# Patient Record
Sex: Female | Born: 1990 | Race: Black or African American | Hispanic: No | Marital: Married | Smoking: Current every day smoker
Health system: Southern US, Community
[De-identification: ages and names within clinical notes are randomized; demographics above are authoritative.]

## PROBLEM LIST (undated history)

## (undated) DIAGNOSIS — I959 Hypotension, unspecified: Secondary | ICD-10-CM

## (undated) DIAGNOSIS — N200 Calculus of kidney: Secondary | ICD-10-CM

## (undated) HISTORY — PX: KIDNEY STONE SURGERY: SHX686

## (undated) HISTORY — DX: Hypotension, unspecified: I95.9

## (undated) HISTORY — DX: Calculus of kidney: N20.0

---

## 2009-03-17 ENCOUNTER — Observation Stay: Payer: Self-pay | Admitting: Obstetrics and Gynecology

## 2009-03-31 ENCOUNTER — Inpatient Hospital Stay: Payer: Self-pay | Admitting: Obstetrics and Gynecology

## 2009-03-31 ENCOUNTER — Observation Stay: Payer: Self-pay | Admitting: Obstetrics and Gynecology

## 2011-09-30 ENCOUNTER — Emergency Department: Payer: Self-pay | Admitting: Emergency Medicine

## 2013-10-25 ENCOUNTER — Emergency Department: Payer: Self-pay | Admitting: Internal Medicine

## 2013-10-25 LAB — COMPREHENSIVE METABOLIC PANEL
ALBUMIN: 3.6 g/dL (ref 3.4–5.0)
ALK PHOS: 52 U/L
Anion Gap: 7 (ref 7–16)
BILIRUBIN TOTAL: 0.3 mg/dL (ref 0.2–1.0)
BUN: 11 mg/dL (ref 7–18)
CREATININE: 0.71 mg/dL (ref 0.60–1.30)
Calcium, Total: 9 mg/dL (ref 8.5–10.1)
Chloride: 104 mmol/L (ref 98–107)
Co2: 25 mmol/L (ref 21–32)
EGFR (Non-African Amer.): >60
Glucose: 76 mg/dL (ref 65–99)
OSMOLALITY: 270 (ref 275–301)
Potassium: 3.6 mmol/L (ref 3.5–5.1)
SGOT(AST): 17 U/L (ref 15–37)
SGPT (ALT): 15 U/L (ref 12–78)
SODIUM: 136 mmol/L (ref 136–145)
TOTAL PROTEIN: 8 g/dL (ref 6.4–8.2)

## 2013-10-25 LAB — CBC WITH DIFFERENTIAL/PLATELET
Basophil #: 0 10*3/uL (ref 0.0–0.1)
Basophil %: 0.5 %
Eosinophil #: 0 10*3/uL (ref 0.0–0.7)
Eosinophil %: 0.1 %
HCT: 40.8 % (ref 35.0–47.0)
HGB: 13.3 g/dL (ref 12.0–16.0)
Lymphocyte #: 1 10*3/uL (ref 1.0–3.6)
Lymphocyte %: 26.3 %
MCH: 29 pg (ref 26.0–34.0)
MCHC: 32.6 g/dL (ref 32.0–36.0)
MCV: 89 fL (ref 80–100)
Monocyte #: 0.8 x10 3/mm (ref 0.2–0.9)
Monocyte %: 20.9 %
NEUTROS PCT: 52.2 %
Neutrophil #: 2 10*3/uL (ref 1.4–6.5)
Platelet: 189 10*3/uL (ref 150–440)
RBC: 4.58 10*6/uL (ref 3.80–5.20)
RDW: 16.6 % — ABNORMAL HIGH (ref 11.5–14.5)
WBC: 3.9 10*3/uL (ref 3.6–11.0)

## 2013-10-25 LAB — LIPASE, BLOOD: Lipase: 282 U/L (ref 73–393)

## 2014-08-10 ENCOUNTER — Emergency Department: Payer: Self-pay | Admitting: Emergency Medicine

## 2014-08-10 LAB — CBC
HCT: 36.4 % (ref 35.0–47.0)
HGB: 11.6 g/dL — AB (ref 12.0–16.0)
MCH: 29.3 pg (ref 26.0–34.0)
MCHC: 31.9 g/dL — ABNORMAL LOW (ref 32.0–36.0)
MCV: 92 fL (ref 80–100)
PLATELETS: 233 10*3/uL (ref 150–440)
RBC: 3.95 10*6/uL (ref 3.80–5.20)
RDW: 16.7 % — ABNORMAL HIGH (ref 11.5–14.5)
WBC: 8.2 10*3/uL (ref 3.6–11.0)

## 2014-08-10 LAB — URINALYSIS, COMPLETE
BILIRUBIN, UR: NEGATIVE
Bacteria: NONE SEEN
Glucose,UR: NEGATIVE mg/dL (ref 0–75)
KETONE: NEGATIVE
Leukocyte Esterase: NEGATIVE
Nitrite: NEGATIVE
PROTEIN: NEGATIVE
Ph: 7 (ref 4.5–8.0)
RBC,UR: 9 /HPF (ref 0–5)
Specific Gravity: 1.015 (ref 1.003–1.030)
Squamous Epithelial: 2
WBC UR: 1 /HPF (ref 0–5)

## 2014-08-10 LAB — GC/CHLAMYDIA PROBE AMP

## 2014-08-10 LAB — WET PREP, GENITAL

## 2014-08-10 LAB — HCG, QUANTITATIVE, PREGNANCY: BETA HCG, QUANT.: 2809 m[IU]/mL — AB

## 2014-09-24 NOTE — L&D Delivery Note (Signed)
Delivery Summary for Barbara Mendez  Labor Events:   Preterm labor:   Rupture date:   Rupture time:   Rupture type: Intact  Fluid Color:   Induction:   Augmentation:   Complications:   Cervical ripening:          Delivery:   Episiotomy:   Lacerations:   Repair suture:   Repair # of packets:   Blood loss (ml): 600   Information for the patient's newborn:  Tameka, Hoiland [[id number]]    Delivery 06/13/2015 8:17 AM by  C-Section, Low Transverse Sex:  female Gestational Age: [redacted]w[redacted]d Delivery Clinician:  Hildred Laser Living?: Yes        APGARS  One minute Five minutes Ten minutes  Skin color: 0   1      Heart rate: 2   2      Grimace: 2   2      Muscle tone: 2   2      Breathing: 2   2      Totals: 8  9      Presentation/position:      Resuscitation:   Cord information:    Disposition of cord blood:     Blood gases sent?  Complications:   Placenta: Delivered:       appearance Newborn Measurements: Weight: 5 lb 15.6 oz (2710 g)  Height:    Head circumference:    Chest circumference:    Other providers: Registered Nurse Transition RN Venetia Night  Additional  information: Forceps:   Vacuum:   Breech:   Observed anomalies        See Cesarean Operative Note for details of procedure.    Hildred Laser, MD [facility]

## 2014-12-16 LAB — OB RESULTS CONSOLE HGB/HCT, BLOOD
HCT: 34 %
Hemoglobin: 11.3 g/dL

## 2014-12-16 LAB — OB RESULTS CONSOLE HEPATITIS B SURFACE ANTIGEN: Hepatitis B Surface Ag: NEGATIVE

## 2014-12-16 LAB — OB RESULTS CONSOLE HIV ANTIBODY (ROUTINE TESTING): HIV: NONREACTIVE

## 2014-12-16 LAB — OB RESULTS CONSOLE GC/CHLAMYDIA
CHLAMYDIA, DNA PROBE: NEGATIVE
Gonorrhea: NEGATIVE

## 2014-12-16 LAB — OB RESULTS CONSOLE RUBELLA ANTIBODY, IGM: RUBELLA: IMMUNE

## 2014-12-16 LAB — OB RESULTS CONSOLE VARICELLA ZOSTER ANTIBODY, IGG: Varicella: IMMUNE

## 2014-12-16 LAB — OB RESULTS CONSOLE ANTIBODY SCREEN: Antibody Screen: NEGATIVE

## 2014-12-16 LAB — OB RESULTS CONSOLE ABO/RH: RH Type: POSITIVE

## 2014-12-16 LAB — OB RESULTS CONSOLE PLATELET COUNT: Platelets: 277 10*3/uL

## 2014-12-16 LAB — OB RESULTS CONSOLE RPR: RPR: NONREACTIVE

## 2015-03-10 ENCOUNTER — Ambulatory Visit (INDEPENDENT_AMBULATORY_CARE_PROVIDER_SITE_OTHER): Payer: Medicaid Other | Admitting: Obstetrics and Gynecology

## 2015-03-10 VITALS — BP 101/68 | HR 89 | Ht 65.0 in | Wt 152.0 lb

## 2015-03-10 DIAGNOSIS — O34219 Maternal care for unspecified type scar from previous cesarean delivery: Secondary | ICD-10-CM | POA: Insufficient documentation

## 2015-03-10 DIAGNOSIS — I959 Hypotension, unspecified: Secondary | ICD-10-CM | POA: Insufficient documentation

## 2015-03-10 DIAGNOSIS — O2652 Maternal hypotension syndrome, second trimester: Secondary | ICD-10-CM

## 2015-03-10 DIAGNOSIS — O3421 Maternal care for scar from previous cesarean delivery: Secondary | ICD-10-CM

## 2015-03-10 DIAGNOSIS — Z3482 Encounter for supervision of other normal pregnancy, second trimester: Secondary | ICD-10-CM

## 2015-03-10 DIAGNOSIS — O09292 Supervision of pregnancy with other poor reproductive or obstetric history, second trimester: Secondary | ICD-10-CM

## 2015-03-10 DIAGNOSIS — O4441 Low lying placenta NOS or without hemorrhage, first trimester: Secondary | ICD-10-CM | POA: Insufficient documentation

## 2015-03-10 DIAGNOSIS — O09299 Supervision of pregnancy with other poor reproductive or obstetric history, unspecified trimester: Secondary | ICD-10-CM | POA: Insufficient documentation

## 2015-03-10 DIAGNOSIS — Z87891 Personal history of nicotine dependence: Secondary | ICD-10-CM

## 2015-03-10 DIAGNOSIS — Z3492 Encounter for supervision of normal pregnancy, unspecified, second trimester: Secondary | ICD-10-CM

## 2015-03-10 DIAGNOSIS — R110 Nausea: Secondary | ICD-10-CM

## 2015-03-10 LAB — POCT URINALYSIS DIPSTICK
BILIRUBIN UA: NEGATIVE
Glucose, UA: NEGATIVE
Ketones, UA: NEGATIVE
LEUKOCYTES UA: NEGATIVE
Nitrite, UA: NEGATIVE
PH UA: 6.5
Protein, UA: NEGATIVE
Spec Grav, UA: 1.02
Urobilinogen, UA: 0.2

## 2015-03-10 MED ORDER — DOXYLAMINE-PYRIDOXINE 10-10 MG PO TBEC: 2.0000 | DELAYED_RELEASE_TABLET | Freq: Every day | ORAL | Status: DC

## 2015-03-10 NOTE — Progress Notes (Signed)
Patient reports that nausea (without vomiting) has returned.  Has not tried anything to alleviate symptoms.  Will give refill on prescription for Diclegis.  RTC in 4 weeks.  For glucola at that time.

## 2015-03-10 NOTE — Patient Instructions (Signed)
Begin Diclegis again for nausea, as needed.  RTC in 4 weeks.

## 2015-04-07 ENCOUNTER — Encounter: Payer: Self-pay | Admitting: Obstetrics and Gynecology

## 2015-04-07 ENCOUNTER — Ambulatory Visit (INDEPENDENT_AMBULATORY_CARE_PROVIDER_SITE_OTHER): Payer: Medicaid Other | Admitting: Obstetrics and Gynecology

## 2015-04-07 VITALS — BP 96/61 | HR 101 | Wt 159.2 lb

## 2015-04-07 DIAGNOSIS — Z3483 Encounter for supervision of other normal pregnancy, third trimester: Secondary | ICD-10-CM

## 2015-04-07 DIAGNOSIS — O3421 Maternal care for scar from previous cesarean delivery: Secondary | ICD-10-CM

## 2015-04-07 DIAGNOSIS — Z23 Encounter for immunization: Secondary | ICD-10-CM

## 2015-04-07 DIAGNOSIS — Z131 Encounter for screening for diabetes mellitus: Secondary | ICD-10-CM

## 2015-04-07 DIAGNOSIS — Z3493 Encounter for supervision of normal pregnancy, unspecified, third trimester: Secondary | ICD-10-CM

## 2015-04-07 DIAGNOSIS — O34219 Maternal care for unspecified type scar from previous cesarean delivery: Secondary | ICD-10-CM

## 2015-04-07 LAB — POCT URINALYSIS DIPSTICK
BILIRUBIN UA: NEGATIVE
Glucose, UA: NEGATIVE
Ketones, UA: NEGATIVE
LEUKOCYTES UA: NEGATIVE
Nitrite, UA: NEGATIVE
PH UA: 7.5
Protein, UA: NEGATIVE
SPEC GRAV UA: 1.01
Urobilinogen, UA: NEGATIVE

## 2015-04-07 NOTE — Progress Notes (Signed)
ROB: Patient c/o abdominal pain last week, sharp, sudden in right pelvis, radiating to left side while at rest.  Lasted ~ 1 hour then resolved.  Has not had any further episodes.  Recommend adequate hydration, Tylenol prn.   For Tdap today.  Performing glucola screen.  RTC in 2 weeks.  To schedule date for C-section next visit.

## 2015-04-08 LAB — HEMOGLOBIN AND HEMATOCRIT, BLOOD
HEMATOCRIT: 27.6 % — AB (ref 34.0–46.6)
Hemoglobin: 9 g/dL — ABNORMAL LOW (ref 11.1–15.9)

## 2015-04-08 LAB — GLUCOSE TOLERANCE, 1 HOUR: GLUCOSE, 1HR PP: 82 mg/dL (ref 65–199)

## 2015-04-11 ENCOUNTER — Telehealth: Payer: Self-pay

## 2015-04-11 DIAGNOSIS — O99013 Anemia complicating pregnancy, third trimester: Secondary | ICD-10-CM

## 2015-04-11 MED ORDER — FERROUS SULFATE 325 (65 FE) MG PO TABS: 325.0000 mg | ORAL_TABLET | Freq: Two times a day (BID) | ORAL | Status: DC

## 2015-04-11 MED ORDER — FERROUS SULFATE 325 (65 FE) MG PO TABS: 325.0000 mg | ORAL_TABLET | Freq: Every day | ORAL | Status: DC

## 2015-04-11 NOTE — Telephone Encounter (Signed)
LM for pt informing her of normal glucola and the need to take an iron supplement. RX sent in.

## 2015-04-11 NOTE — Telephone Encounter (Signed)
-----   Message from Hildred LaserAnika Cherry, MD sent at 04/10/2015  4:18 PM EDT ----- Please inform patient that 1 hr glucose was normal.  Patient is anemic and should be taking iron twice daily.  Also can prescribe Colace if constipation occurs.

## 2015-04-21 ENCOUNTER — Ambulatory Visit (INDEPENDENT_AMBULATORY_CARE_PROVIDER_SITE_OTHER): Payer: Medicaid Other | Admitting: Obstetrics and Gynecology

## 2015-04-21 ENCOUNTER — Encounter: Payer: Self-pay | Admitting: Obstetrics and Gynecology

## 2015-04-21 VITALS — BP 96/62 | HR 98 | Wt 161.3 lb

## 2015-04-21 DIAGNOSIS — O99013 Anemia complicating pregnancy, third trimester: Secondary | ICD-10-CM

## 2015-04-21 DIAGNOSIS — Z3493 Encounter for supervision of normal pregnancy, unspecified, third trimester: Secondary | ICD-10-CM

## 2015-04-21 DIAGNOSIS — Z3483 Encounter for supervision of other normal pregnancy, third trimester: Secondary | ICD-10-CM

## 2015-04-21 DIAGNOSIS — O3421 Maternal care for scar from previous cesarean delivery: Secondary | ICD-10-CM

## 2015-04-21 DIAGNOSIS — O34219 Maternal care for unspecified type scar from previous cesarean delivery: Secondary | ICD-10-CM

## 2015-04-21 LAB — POCT URINALYSIS DIPSTICK
Bilirubin, UA: NEGATIVE
GLUCOSE UA: NEGATIVE
KETONES UA: NEGATIVE
Nitrite, UA: NEGATIVE
PH UA: 7.5
PROTEIN UA: NEGATIVE
RBC UA: NEGATIVE
Spec Grav, UA: <=1.005
Urobilinogen, UA: 0.2

## 2015-04-21 NOTE — Progress Notes (Signed)
Patient doing well.  No other complaints or concerns.  PTL and fetal movement precautions reviewed.  Patient with mild anemia noted on recent labs, has initiated iron therapy. Normal RGS. C-section scheduled for 06/13/2015.  RTC in 2 weeks.

## 2015-05-05 ENCOUNTER — Ambulatory Visit (INDEPENDENT_AMBULATORY_CARE_PROVIDER_SITE_OTHER): Payer: Medicaid Other | Admitting: Obstetrics and Gynecology

## 2015-05-05 VITALS — BP 96/66 | HR 93 | Wt 164.5 lb

## 2015-05-05 DIAGNOSIS — R630 Anorexia: Secondary | ICD-10-CM

## 2015-05-05 DIAGNOSIS — R319 Hematuria, unspecified: Secondary | ICD-10-CM

## 2015-05-05 DIAGNOSIS — Z3493 Encounter for supervision of normal pregnancy, unspecified, third trimester: Secondary | ICD-10-CM

## 2015-05-05 LAB — POCT URINALYSIS DIPSTICK
Bilirubin, UA: NEGATIVE
GLUCOSE UA: NEGATIVE
Ketones, UA: NEGATIVE
NITRITE UA: NEGATIVE
PROTEIN UA: NEGATIVE
SPEC GRAV UA: 1.01
Urobilinogen, UA: 0.2
pH, UA: 7

## 2015-05-05 NOTE — Progress Notes (Signed)
ROB: Patient c/o decreased appetite and low back pain in a.m.  Advised on nutritional supplements (Boost/Ensure, protein bars or shakes).  Can take Tylenol prn back pain.  PTL precautions given.  Fetal kick counts discussed. RBS and moderate LE on UA today.  Will send for culture. RTC in 2 weeks.

## 2015-05-05 NOTE — Progress Notes (Signed)
Pt c/o of low back pain when she wakes up in the a.m.

## 2015-05-06 LAB — URINE CULTURE

## 2015-05-18 ENCOUNTER — Telehealth: Payer: Self-pay | Admitting: Obstetrics and Gynecology

## 2015-05-18 ENCOUNTER — Ambulatory Visit (INDEPENDENT_AMBULATORY_CARE_PROVIDER_SITE_OTHER): Payer: Medicaid Other | Admitting: Obstetrics and Gynecology

## 2015-05-18 VITALS — BP 112/77 | HR 73 | Wt 165.5 lb

## 2015-05-18 DIAGNOSIS — Z3493 Encounter for supervision of normal pregnancy, unspecified, third trimester: Secondary | ICD-10-CM

## 2015-05-18 DIAGNOSIS — O3421 Maternal care for scar from previous cesarean delivery: Secondary | ICD-10-CM

## 2015-05-18 DIAGNOSIS — O34219 Maternal care for unspecified type scar from previous cesarean delivery: Secondary | ICD-10-CM

## 2015-05-18 DIAGNOSIS — O26843 Uterine size-date discrepancy, third trimester: Secondary | ICD-10-CM

## 2015-05-18 LAB — POCT URINALYSIS DIPSTICK
Bilirubin, UA: NEGATIVE
Blood, UA: NEGATIVE
Glucose, UA: NEGATIVE
Ketones, UA: NEGATIVE
Leukocytes, UA: NEGATIVE
Nitrite, UA: NEGATIVE
Protein, UA: NEGATIVE
Spec Grav, UA: 1.01
Urobilinogen, UA: NEGATIVE
pH, UA: 6

## 2015-05-18 NOTE — Telephone Encounter (Signed)
Pt informed this was normal, if period like contact office but may have some spotting for next couple of days.

## 2015-05-18 NOTE — Telephone Encounter (Signed)
PT WAS SEEN THIS MORNING AND SHE WAS CHECKED FOR DILATION AND SHE WENT TO USE THE RESTROOM AND THERE IS SOME BLOOD, WOULD LIKE A CALL BACK.

## 2015-05-18 NOTE — Progress Notes (Signed)
ROB: Patient reports complaints of round ligament pain.  Advised on Tylenol.  PTL precautions given.  Discussed contraception, desires OCPs.  36 week labs done today. Weight gain appropriate but size<dates x 2 visits. Will order growth scan. RTC in 1 week.

## 2015-05-19 LAB — GC/CHLAMYDIA PROBE AMP
CHLAMYDIA, DNA PROBE: NEGATIVE
NEISSERIA GONORRHOEAE BY PCR: NEGATIVE

## 2015-05-22 LAB — CULTURE, BETA STREP (GROUP B ONLY): Strep Gp B Culture: NEGATIVE

## 2015-05-25 ENCOUNTER — Ambulatory Visit: Payer: Medicaid Other

## 2015-05-25 ENCOUNTER — Ambulatory Visit (INDEPENDENT_AMBULATORY_CARE_PROVIDER_SITE_OTHER): Payer: Medicaid Other | Admitting: Obstetrics and Gynecology

## 2015-05-25 ENCOUNTER — Encounter: Payer: Self-pay | Admitting: Obstetrics and Gynecology

## 2015-05-25 VITALS — BP 101/64 | HR 84 | Wt 165.5 lb

## 2015-05-25 DIAGNOSIS — O26843 Uterine size-date discrepancy, third trimester: Secondary | ICD-10-CM | POA: Diagnosis not present

## 2015-05-25 DIAGNOSIS — Z331 Pregnant state, incidental: Secondary | ICD-10-CM

## 2015-05-25 DIAGNOSIS — O4103X1 Oligohydramnios, third trimester, fetus 1: Secondary | ICD-10-CM

## 2015-05-25 NOTE — Patient Instructions (Signed)
Oligohydramnios An unborn baby (fetus) lives in the mother's uterus in a sac of amniotic fluid. This fluid:  Protects the fetus from trauma.  Helps the fetus move freely inside the uterus.  Helps the fetal lungs, kidneys, and digestive system develop.  Protects the baby from infections. Oligohydramnios is when there is not enough amniotic fluid in the amniotic sac. If this happens early in pregnancy, a fetus might not develop normally. If this happens in the second half of a pregnancy, a fetus might not grow as much as it should and could cause problems during delivery.  Oligohydramnios can also cause:  Pregnancy loss (miscarriage).  Premature birth.  Deformities of the face or body.  Problems with muscles and bones.  Pressure and compression on the umbilical cord, which decreases oxygen to the fetus.  Lung problems.  Stillbirth. CAUSES A cause cannot always be found.However, possible causes include:  A leak or a tear in the amniotic sac.  A problem with the placenta.  Having identical twins who share the same placenta.  A fetal birth defect. This is usually something in the fetal kidneys or urinary tract that has not developed as it should.  A pregnancy that goes past the due date.  Something that affects the mother's body, such as:  Dehydration.  High blood pressure.  Diabetes.  Some medications (examples include ibuprofen and blood pressure medicines).  A disease that affects the skin, joints, kidneys, and other organs (systemic lupus).  Birth defects. SYMPTOMS  There may be no symptoms.  Leaking fluid from the vagina.  Measuring smaller than usual uterus at a routine pregnancy exam. DIAGNOSIS To diagnose and evaluate oligohydramnios, your caregiver may:  Order a prenatal ultrasound test. This test:  Measures the amniotic fluid level. This will show if the amount of fluid is right for the stage of pregnancy.  Checks the fetal  kidneys.  Checks fetal growth.  Evaluates the placenta.  Confirm that you broke your water, if this is suspected by your caregiver.  Order a nonstress test. This noninvasive test is an assessment of fetal well-being.It monitors the fetal heart rate patterns over a 20-minute period.  Order a biophysical profile. This test measures and evaluates 5 observations of the baby (results of nonstress testing, fetal breathing, movements, muscle tone, and amount of amniotic fluid).  Assess fetal kick counts. Tell your caregiver if the baby appears to become less active.  Order a uterine artery Doppler study. This is a type of ultrasound. It can show if enough blood and nourishment are getting to the fetus through the placenta and umbilical cord.  Check your blood pressure.  Check your blood sugar. TREATMENT Treatment will depend on how low the fluid is, how far along in the pregnancy you are, and your overall health. Treatment options include:  Watching and waiting. You will need to be checked more often than normal.  Increasing your fluid intake. This may be done by mouth, or you might get the fluids through the vein (intravenously, IV).  Delivering the baby if recommended by your caregiver. HOME CARE INSTRUCTIONS  Only take medicine as directed by your caregiver, especially if you have a medical problem (diabetes, high blood pressure).  Follow your caregiver's instructions regarding physical activity, especially if you have a medical problem (diabetes, high blood pressure).  Keep all prenatal care appointments.  Rest as much as possible. Your caregiver may put you on bed rest.  Drink enough fluids to keep your urine clear or pale yellow.  Eat a healthy and nourishing diet.  Do not smoke, drink alcohol, or take illegal drugs. SEEK MEDICAL CARE IF:  You have any questions or worries about your pregnancy.  You notice a decrease in fetal movement. SEEK IMMEDIATE MEDICAL CARE IF:    Fluid comes out of your vagina.  You start to have labor pains (contractions).  You have a fever. Document Released: 12/26/2010 Document Revised: 01/25/2014 Document Reviewed: 12/26/2010 [facility] Patient Information 2015 [place], [place]. This information is not intended to replace advice given to you by your health care provider. Make sure you discuss any questions you have with your health care provider.   Nonstress Test The nonstress test is a procedure that monitors the fetus's heartbeat. The test will monitor the heartbeat when the fetus is at rest and while the fetus is moving. In a healthy fetus, there will be an increase in fetal heart rate when the fetus moves or kicks. The heart rate will decrease at rest. This test helps determine if the fetus is healthy. Your health care provider will look at a number of patterns in the heart rate tracing to make sure your baby is thriving. If there is concern, your health care provider may order additional tests or may suggest another course of action. This test is often done in the third trimester and can help determine if an early delivery is needed and safe. Common reasons to have this test are:  You are past your due date.  You have a high-risk pregnancy.  You are feeling less movement than normal.  You have lost a pregnancy in the past.  Your health care provider suspects fetal growth problems.  You have too much or too little amniotic fluid. BEFORE THE PROCEDURE  Eat a meal right before the test or as directed by your health care provider. Food may help stimulate fetal movements.  Use the restroom right before the test. PROCEDURE  Two belts will be placed around your abdomen. These belts have monitors attached to them. One records the fetal heart rate and the other records uterine contractions.  You may be asked to lie down on your side or to stay sitting upright.  You may be given a button to press when you feel  movement.  The fetal heartbeat is listened to and watched on a screen. The heartbeat is recorded on a sheet of paper.  If the fetus seems to be sleeping, you may be asked to drink some juice or soda, gently press your abdomen, or make some noise to wake the fetus. AFTER THE PROCEDURE  Your health care provider will discuss the test results with you and make recommendations for the near future. Document Released: 08/31/2002 Document Revised: 01/25/2014 Document Reviewed: 10/14/2012 [facility] Patient Information 2015 [place], [place]. This information is not intended to replace advice given to you by your health care provider. Make sure you discuss any questions you have with your health care provider.

## 2015-05-25 NOTE — Progress Notes (Signed)
ROB-denies any complaints 

## 2015-05-25 NOTE — Progress Notes (Signed)
ROB- Denies decreased FM, to do FKC bid, and start bi-weekly testing  Indications: Growth and AFI Findings:  Singleton intrauterine pregnancy is visualized with FHR at 133 BPM. Biometrics give an (U/S) Gestational age of [redacted] weeks and 4 days and an (U/S) EDD of 07/02/15; this DOES NOT correlate with the clinically established EDD of 06/15/15.   Fetal presentation is vertex, spine left lateral.  EFW: 2558 g ( 5 lbs. 10 oz. ).  Placenta: anterior, grade 2. AFI: REDUCED at 5.8 cm   Impression: 1. 34  Week 4 day Viable Singleton Intrauterine pregnancy by U/S. 2. (U/S) EDD is NOT consistent with Clinically established (LMP) EDD of 06/15/15. 3. AFI is reduced at 5.8 cm  Recommendations: 1.Clinical correlation with the patient's History and Physical Exam.   Elliott,Teresa, Rad Tech  Scan reviewed and agree with findings, patient counseled on findings.  Melody [employer] CNM

## 2015-05-31 ENCOUNTER — Ambulatory Visit: Payer: Medicaid Other

## 2015-05-31 ENCOUNTER — Ambulatory Visit (INDEPENDENT_AMBULATORY_CARE_PROVIDER_SITE_OTHER): Payer: Medicaid Other | Admitting: Obstetrics and Gynecology

## 2015-05-31 VITALS — BP 98/65 | HR 74 | Wt 164.5 lb

## 2015-05-31 DIAGNOSIS — Z3483 Encounter for supervision of other normal pregnancy, third trimester: Secondary | ICD-10-CM | POA: Diagnosis not present

## 2015-05-31 DIAGNOSIS — O288 Other abnormal findings on antenatal screening of mother: Secondary | ICD-10-CM

## 2015-05-31 DIAGNOSIS — Z3493 Encounter for supervision of normal pregnancy, unspecified, third trimester: Secondary | ICD-10-CM

## 2015-05-31 DIAGNOSIS — O289 Unspecified abnormal findings on antenatal screening of mother: Secondary | ICD-10-CM

## 2015-05-31 LAB — POCT URINALYSIS DIPSTICK
Bilirubin, UA: NEGATIVE
Glucose, UA: NEGATIVE
KETONES UA: NEGATIVE
Nitrite, UA: NEGATIVE
PROTEIN UA: NEGATIVE
SPEC GRAV UA: 1.01
UROBILINOGEN UA: 0.2
pH, UA: 7

## 2015-05-31 NOTE — H&P (Signed)
Obstetric Preoperative History and Physical  Barbara Mendez is a 24 y.o. G3P1011 with IUP at [redacted]w[redacted]d presenting for presenting for pre-op for scheduled repeat cesarean section.  C-section scheduled for 39.3 weeks on 06/13/2015.  No acute concerns.   Prenatal Course Source of Care: [facility] with onset of care at 11 weeks Pregnancy complications or risks: Patient Active Problem List   Diagnosis Date Noted  . AFI (amniotic fluid index) borderline low 05/31/2015  . Nausea and vomiting of pregnancy 03/10/2015  . Hypotension 03/10/2015  . History of cesarean section complicating pregnancy 03/10/2015  . Low-lying placenta in first trimester 03/10/2015  . History of tobacco abuse 03/10/2015  . History of miscarriage, currently pregnant 03/10/2015   She plans to breastfeed She desires oral contraceptives (estrogen/progesterone) for postpartum contraception.   Prenatal labs and studies: ABO, Rh: O/Positive/-- (03/24 0000) Antibody: Negative (03/24 0000) Rubella: Immune (03/24 0000) RPR: Nonreactive (03/24 0000)  HBsAg: Negative (03/24 0000)  HIV: Non-reactive (03/24 0000)  GBS: negative 1 hr Glucola  normal Genetic screening normal Anatomy US normal  Prenatal Transfer Tool  Maternal Diabetes: No Genetic Screening: Normal Maternal Ultrasounds/Referrals: Abnormal:  Findings:   Other:SGA fetus at 36 weeks Fetal Ultrasounds or other Referrals:  None Maternal Substance Abuse:  No Significant Maternal Medications:  None Significant Maternal Lab Results: None  Past Medical History  Diagnosis Date  . Hypotension     Past Surgical History  Procedure Laterality Date  . Cesarean section      OB History  Gravida Para Term Preterm AB SAB TAB Ectopic Multiple Living  # Outcome Date GA Lbr Len/2nd Weight Sex Delivery Anes PTL Lv  3 Current           2 SAB 08/24/14     SAB   FD  1 Term 2010   8 lb (3.629 kg) F CS-LTranv  N Y      Social History    Social History  . Marital Status: Single    Spouse Name: N/A  . Number of Children: N/A  . Years of Education: N/A   Social History Main Topics  . Smoking status: Former Smoker    Quit date: 09/24/2014  . Smokeless tobacco: Never Used  . Alcohol Use: No  . Drug Use: No  . Sexual Activity: Yes    Birth Control/ Protection: None     Comment: Pregnant   Other Topics Concern  . Not on file   Social History Narrative    Family History  Problem Relation Age of Onset  . Hypertension Father     Current Outpatient Prescriptions on File Prior to Visit  Medication Sig Dispense Refill  . Doxylamine-Pyridoxine (DICLEGIS) 10-10 MG TBEC Take 2 tablets by mouth at bedtime. If symptoms persist, add one tablet in the morning and one in the afternoon 100 tablet 1  . ferrous sulfate 325 (65 FE) MG tablet Take 1 tablet (325 mg total) by mouth 2 (two) times daily with a meal. 60 tablet 3  . Prenatal Vit-Fe Fumarate-FA (MULTIVITAMIN-PRENATAL) 27-0.8 MG TABS tablet Take 1 tablet by mouth daily at 12 noon.     No current facility-administered medications on file prior to visit.    No Known Allergies  Review of Systems: Negative except for what is mentioned in HPI.  Physical Exam: BP 98/65 mmHg  Pulse 74  Wt 164 lb 8 oz (74.617 kg)  LMP 09/08/2014 (Approximate)  FHR by Doppler: 150 bpm GENERAL: Well-developed, well-nourished female in no acute distress.  LUNGS: Clear to auscultation bilaterally.  HEART: Regular rate and rhythm. ABDOMEN: Soft, nontender, nondistended, gravid, well-healed Pfannenstiel incision. PELVIC: Deferred EXTREMITIES: Nontender, no edema, 2+ distal pulses.   Pertinent Labs/Studies:   Results for orders placed or performed in visit on 05/31/15 (from the past 72 hour(s))  POCT urinalysis dipstick     Status: Abnormal   Collection Time: 05/31/15  9:09 AM  Result Value Ref Range   Color, UA Yellow    Clarity, UA Cloudy    Glucose, UA Neg    Bilirubin, UA Neg     Ketones, UA Neg    Spec Grav, UA 1.010    Blood, UA Small    pH, UA 7.0    Protein, UA Neg    Urobilinogen, UA 0.2    Nitrite, UA Neg    Leukocytes, UA moderate (2+) (A) Negative    05/25/2015 OB Ultrasound:  Indications: Growth and AFI Findings:  Singleton intrauterine pregnancy is visualized with FHR at 133 BPM. Biometrics give an (U/S) Gestational age of [redacted] weeks and 4 days and an (U/S) EDD of 07/02/15; this DOES NOT correlate with the clinically established EDD of 06/15/15.  Fetal presentation is vertex, spine left lateral.  EFW: 2558 g ( 5 lbs. 10 oz. ).  Placenta: anterior, grade 2. AFI: REDUCED at 5.8 cm  Assessment and Plan :Barbara Mendez is a 24 y.o. G3P1011 at [redacted]w[redacted]d for pre-op for scheduled cesarean section delivery on 06/13/2015. The patient is understanding of the planned procedure and is aware of and accepting of all surgical risks, including but not limited to: bleeding which may require transfusion or reoperation; infection which may require antibiotics; injury to bowel, bladder, ureters or other surrounding organs which may require repair; injury to the fetus; need for additional procedures including hysterectomy in the event of life-threatening complications; placental abnormalities wth subsequent pregnancies; incisional problems; blood clot disorders which may require blood thinners;, and other postoperative/anesthesia complications. The patient is in agreement with the proposed plan, and gives informed written consent for the procedure. All questions have been answered.  Hildred Laser, MD [facility]

## 2015-05-31 NOTE — Progress Notes (Signed)
Patient with complaints of decreased appetite.  Still notes nausea.  Only taking evening dose of Diclegis, nauseated mostly at night.  Advised on additional afternoon dose.  Also encouraged nutritional supplementation again (Boost/Ensure).  For NST today for low AFI (5.8). Growth lagging, but not less than 10%ile. Will perform weekly serial AFIs.  For pre-op today.  Scheduled for repeat C-section on 9/19, but patient understands need for earlier delivery  Based on AFI and fetal status. Is GBS neg.

## 2015-06-08 ENCOUNTER — Telehealth: Payer: Self-pay

## 2015-06-08 NOTE — Telephone Encounter (Signed)
Called pt informed her that FMLA paperwork has been faxed in for her partner and that a copy was ready for pick up at the office.

## 2015-06-10 ENCOUNTER — Other Ambulatory Visit: Payer: Self-pay | Admitting: *Deleted

## 2015-06-10 ENCOUNTER — Ambulatory Visit: Payer: Medicaid Other

## 2015-06-10 ENCOUNTER — Encounter: Payer: Self-pay | Admitting: Obstetrics and Gynecology

## 2015-06-10 ENCOUNTER — Other Ambulatory Visit: Payer: Medicaid Other

## 2015-06-10 ENCOUNTER — Ambulatory Visit (INDEPENDENT_AMBULATORY_CARE_PROVIDER_SITE_OTHER): Payer: Medicaid Other | Admitting: Obstetrics and Gynecology

## 2015-06-10 ENCOUNTER — Encounter
Admission: RE | Admit: 2015-06-10 | Discharge: 2015-06-10 | Disposition: A | Discharge status: Home / Self Care | Payer: Medicaid Other | Source: Ambulatory Visit | Attending: Obstetrics and Gynecology | Admitting: Obstetrics and Gynecology

## 2015-06-10 VITALS — BP 102/68 | HR 71 | Wt 166.1 lb

## 2015-06-10 DIAGNOSIS — Z01812 Encounter for preprocedural laboratory examination: Secondary | ICD-10-CM | POA: Diagnosis present

## 2015-06-10 DIAGNOSIS — Z3493 Encounter for supervision of normal pregnancy, unspecified, third trimester: Secondary | ICD-10-CM | POA: Diagnosis not present

## 2015-06-10 DIAGNOSIS — O480 Post-term pregnancy: Secondary | ICD-10-CM

## 2015-06-10 LAB — POCT URINALYSIS DIPSTICK
BILIRUBIN UA: NEGATIVE
GLUCOSE UA: NEGATIVE
Ketones, UA: NEGATIVE
LEUKOCYTES UA: NEGATIVE
NITRITE UA: NEGATIVE
Protein, UA: NEGATIVE
Spec Grav, UA: 1.01
UROBILINOGEN UA: 0.2
pH, UA: 6.5

## 2015-06-10 LAB — CBC
HCT: 32.2 % — ABNORMAL LOW (ref 35.0–47.0)
HEMOGLOBIN: 10 g/dL — AB (ref 12.0–16.0)
MCH: 26.4 pg (ref 26.0–34.0)
MCHC: 31 g/dL — ABNORMAL LOW (ref 32.0–36.0)
MCV: 85.2 fL (ref 80.0–100.0)
Platelets: 265 10*3/uL (ref 150–440)
RBC: 3.78 MIL/uL — AB (ref 3.80–5.20)
RDW: 19.6 % — ABNORMAL HIGH (ref 11.5–14.5)
WBC: 11.1 10*3/uL — AB (ref 3.6–11.0)

## 2015-06-10 LAB — ABO/RH: ABO/RH(D): O POS

## 2015-06-10 NOTE — Progress Notes (Signed)
ROB- denies any new complaints 

## 2015-06-10 NOTE — Progress Notes (Signed)
Indications:Limited for AFI Findings:  Singleton intrauterine pregnancy is visualized with FHR at 130 BPM.  Fetal presentation is Vertex.  Stomach, kidneys and bladder visualized.  Placenta: Anterior, grade 2. AFI: 8.5 cm.    Impression: 1. AFI = 8.5 cm. 2. Vertex presentation.  Recommendations: 1.Clinical correlation with the patient's History and Physical Exam.  Moody,Donna, Rad Tech  Scan reviewed and agree with findings Hughes Wyndham Burr,CNM  NST performed today was reviewed and was found to be reactive.  Baseline 125 with moderate variability, no decels noted. AFI normal at 8.5 cm.   Continue recommended antenatal testing and prenatal care.

## 2015-06-11 LAB — RPR: RPR Ser Ql: NONREACTIVE

## 2015-06-13 ENCOUNTER — Encounter: Payer: Self-pay | Admitting: *Deleted

## 2015-06-13 ENCOUNTER — Inpatient Hospital Stay: Payer: Medicaid Other | Admitting: Anesthesiology

## 2015-06-13 ENCOUNTER — Inpatient Hospital Stay
Admission: RE | Admit: 2015-06-13 | Discharge: 2015-06-16 | DRG: 765 | Disposition: A | Discharge status: Home / Self Care | Payer: Medicaid Other | Source: Ambulatory Visit | Attending: Obstetrics and Gynecology | Admitting: Obstetrics and Gynecology

## 2015-06-13 ENCOUNTER — Encounter
Admission: RE | Disposition: A | Discharge status: Home / Self Care | Payer: Self-pay | Source: Ambulatory Visit | Attending: Obstetrics and Gynecology

## 2015-06-13 DIAGNOSIS — O34219 Maternal care for unspecified type scar from previous cesarean delivery: Secondary | ICD-10-CM | POA: Diagnosis present

## 2015-06-13 DIAGNOSIS — O288 Other abnormal findings on antenatal screening of mother: Secondary | ICD-10-CM | POA: Diagnosis present

## 2015-06-13 DIAGNOSIS — I959 Hypotension, unspecified: Secondary | ICD-10-CM | POA: Diagnosis present

## 2015-06-13 DIAGNOSIS — O3421 Maternal care for scar from previous cesarean delivery: Secondary | ICD-10-CM | POA: Diagnosis present

## 2015-06-13 DIAGNOSIS — O9942 Diseases of the circulatory system complicating childbirth: Secondary | ICD-10-CM | POA: Diagnosis present

## 2015-06-13 DIAGNOSIS — Z3A39 39 weeks gestation of pregnancy: Secondary | ICD-10-CM | POA: Diagnosis present

## 2015-06-13 DIAGNOSIS — Z87891 Personal history of nicotine dependence: Secondary | ICD-10-CM

## 2015-06-13 DIAGNOSIS — Z3493 Encounter for supervision of normal pregnancy, unspecified, third trimester: Secondary | ICD-10-CM

## 2015-06-13 DIAGNOSIS — Z98891 History of uterine scar from previous surgery: Secondary | ICD-10-CM

## 2015-06-13 DIAGNOSIS — O365930 Maternal care for other known or suspected poor fetal growth, third trimester, not applicable or unspecified: Secondary | ICD-10-CM | POA: Diagnosis present

## 2015-06-13 LAB — CHLAMYDIA/NGC RT PCR (ARMC ONLY)
CHLAMYDIA TR: NOT DETECTED
N gonorrhoeae: NOT DETECTED

## 2015-06-13 SURGERY — Surgical Case
Anesthesia: Spinal

## 2015-06-13 MED ORDER — SIMETHICONE 80 MG PO CHEW
80.0000 mg | CHEWABLE_TABLET | ORAL | Status: DC
  Administered 2015-06-13 – 2015-06-16 (×2): 80 mg via ORAL
  Filled 2015-06-13 (×3): qty 1

## 2015-06-13 MED ORDER — NALBUPHINE HCL 10 MG/ML IJ SOLN
5.0000 mg | INTRAMUSCULAR | Status: DC | PRN
  Filled 2015-06-13: qty 0.5

## 2015-06-13 MED ORDER — FENTANYL CITRATE (PF) 100 MCG/2ML IJ SOLN: 25.0000 ug | INTRAMUSCULAR | Status: DC | PRN

## 2015-06-13 MED ORDER — MORPHINE SULFATE (PF) 0.5 MG/ML IJ SOLN
INTRAMUSCULAR | Status: DC | PRN
  Administered 2015-06-13: .1 mg via EPIDURAL

## 2015-06-13 MED ORDER — NALBUPHINE HCL 10 MG/ML IJ SOLN
5.0000 mg | Freq: Once | INTRAMUSCULAR | Status: DC | PRN
  Filled 2015-06-13: qty 0.5

## 2015-06-13 MED ORDER — ZOLPIDEM TARTRATE 5 MG PO TABS: 5.0000 mg | ORAL_TABLET | Freq: Every evening | ORAL | Status: DC | PRN

## 2015-06-13 MED ORDER — FERROUS SULFATE 325 (65 FE) MG PO TABS
325.0000 mg | ORAL_TABLET | Freq: Two times a day (BID) | ORAL | Status: DC
  Administered 2015-06-14 – 2015-06-16 (×4): 325 mg via ORAL
  Filled 2015-06-13 (×5): qty 1

## 2015-06-13 MED ORDER — OXYCODONE-ACETAMINOPHEN 5-325 MG PO TABS: 1.0000 | ORAL_TABLET | ORAL | Status: DC | PRN

## 2015-06-13 MED ORDER — KETOROLAC TROMETHAMINE 30 MG/ML IJ SOLN: 30.0000 mg | Freq: Four times a day (QID) | INTRAMUSCULAR | Status: AC | PRN

## 2015-06-13 MED ORDER — MAGNESIUM HYDROXIDE 400 MG/5ML PO SUSP: 30.0000 mL | ORAL | Status: DC | PRN

## 2015-06-13 MED ORDER — MEPERIDINE HCL 25 MG/ML IJ SOLN: 6.2500 mg | INTRAMUSCULAR | Status: DC | PRN

## 2015-06-13 MED ORDER — NALOXONE HCL 1 MG/ML IJ SOLN: 1.0000 ug/kg/h | INTRAMUSCULAR | Status: DC | PRN

## 2015-06-13 MED ORDER — CITRIC ACID-SODIUM CITRATE 334-500 MG/5ML PO SOLN
ORAL | Status: AC
  Administered 2015-06-13: 30 mL via ORAL
  Filled 2015-06-13: qty 15

## 2015-06-13 MED ORDER — LANOLIN HYDROUS EX OINT
1.0000 | TOPICAL_OINTMENT | CUTANEOUS | Status: DC | PRN
Start: 2015-06-13 — End: 2015-06-16

## 2015-06-13 MED ORDER — DEXTROSE 5 % IV SOLN
2.0000 g | INTRAVENOUS | Status: DC
  Filled 2015-06-13: qty 2

## 2015-06-13 MED ORDER — DIPHENHYDRAMINE HCL 50 MG/ML IJ SOLN: 12.5000 mg | INTRAMUSCULAR | Status: DC | PRN

## 2015-06-13 MED ORDER — MENTHOL 3 MG MT LOZG: 1.0000 | LOZENGE | OROMUCOSAL | Status: DC | PRN

## 2015-06-13 MED ORDER — SODIUM CHLORIDE 0.9 % IJ SOLN: 3.0000 mL | INTRAMUSCULAR | Status: DC | PRN

## 2015-06-13 MED ORDER — LIDOCAINE 5 % EX PTCH
1.0000 | MEDICATED_PATCH | Freq: Once | CUTANEOUS | Status: AC
  Administered 2015-06-13: 1 via TRANSDERMAL
  Filled 2015-06-13: qty 1

## 2015-06-13 MED ORDER — PHENYLEPHRINE HCL 10 MG/ML IJ SOLN
INTRAMUSCULAR | Status: DC | PRN
  Administered 2015-06-13: 150 ug via INTRAVENOUS
  Administered 2015-06-13: 100 ug via INTRAVENOUS

## 2015-06-13 MED ORDER — OXYTOCIN 40 UNITS IN LACTATED RINGERS INFUSION - SIMPLE MED: 62.5000 mL/h | INTRAVENOUS | Status: AC

## 2015-06-13 MED ORDER — NALOXONE HCL 0.4 MG/ML IJ SOLN
0.4000 mg | INTRAMUSCULAR | Status: DC | PRN
Start: 2015-06-13 — End: 2015-06-16

## 2015-06-13 MED ORDER — SIMETHICONE 80 MG PO CHEW
80.0000 mg | CHEWABLE_TABLET | ORAL | Status: DC | PRN
  Administered 2015-06-15 (×2): 80 mg via ORAL
  Filled 2015-06-13: qty 1

## 2015-06-13 MED ORDER — SENNOSIDES-DOCUSATE SODIUM 8.6-50 MG PO TABS
2.0000 | ORAL_TABLET | ORAL | Status: DC
  Administered 2015-06-14 – 2015-06-16 (×3): 2 via ORAL
  Filled 2015-06-13 (×3): qty 2

## 2015-06-13 MED ORDER — LACTATED RINGERS IV SOLN
INTRAVENOUS | Status: DC
  Administered 2015-06-13 (×3): via INTRAVENOUS

## 2015-06-13 MED ORDER — OXYTOCIN 40 UNITS IN LACTATED RINGERS INFUSION - SIMPLE MED
INTRAVENOUS | Status: AC
  Administered 2015-06-13: 08:00:00
  Filled 2015-06-13: qty 1000

## 2015-06-13 MED ORDER — ACETAMINOPHEN 325 MG PO TABS: 650.0000 mg | ORAL_TABLET | ORAL | Status: DC | PRN

## 2015-06-13 MED ORDER — KETOROLAC TROMETHAMINE 30 MG/ML IJ SOLN
INTRAMUSCULAR | Status: AC
  Administered 2015-06-13: 30 mg via INTRAVENOUS
  Filled 2015-06-13: qty 1

## 2015-06-13 MED ORDER — DIBUCAINE 1 % RE OINT: 1.0000 "application " | TOPICAL_OINTMENT | RECTAL | Status: DC | PRN

## 2015-06-13 MED ORDER — IBUPROFEN 800 MG PO TABS
800.0000 mg | ORAL_TABLET | Freq: Four times a day (QID) | ORAL | Status: DC
  Administered 2015-06-14 – 2015-06-16 (×7): 800 mg via ORAL
  Filled 2015-06-13 (×9): qty 1

## 2015-06-13 MED ORDER — CEFAZOLIN SODIUM-DEXTROSE 2-3 GM-% IV SOLR
2.0000 g | Freq: Once | INTRAVENOUS | Status: AC
  Administered 2015-06-13: 2 g via INTRAVENOUS

## 2015-06-13 MED ORDER — LIDOCAINE 5 % EX PTCH
1.0000 | MEDICATED_PATCH | CUTANEOUS | Status: AC
  Administered 2015-06-14: 1 via TRANSDERMAL
  Filled 2015-06-13: qty 1

## 2015-06-13 MED ORDER — KETOROLAC TROMETHAMINE 30 MG/ML IJ SOLN
30.0000 mg | Freq: Four times a day (QID) | INTRAMUSCULAR | Status: AC | PRN
  Administered 2015-06-13 – 2015-06-14 (×4): 30 mg via INTRAVENOUS
  Filled 2015-06-13 (×3): qty 1

## 2015-06-13 MED ORDER — DIPHENHYDRAMINE HCL 25 MG PO CAPS: 25.0000 mg | ORAL_CAPSULE | ORAL | Status: DC | PRN

## 2015-06-13 MED ORDER — LACTATED RINGERS IV SOLN
INTRAVENOUS | Status: DC
  Administered 2015-06-13 (×3): via INTRAVENOUS

## 2015-06-13 MED ORDER — PRENATAL MULTIVITAMIN CH
1.0000 | ORAL_TABLET | Freq: Every day | ORAL | Status: DC
Start: 2015-06-13 — End: 2015-06-16
  Administered 2015-06-14 – 2015-06-16 (×2): 1 via ORAL
  Filled 2015-06-13 (×3): qty 1

## 2015-06-13 MED ORDER — WITCH HAZEL-GLYCERIN EX PADS: 1.0000 "application " | MEDICATED_PAD | CUTANEOUS | Status: DC | PRN

## 2015-06-13 MED ORDER — ONDANSETRON HCL 4 MG/2ML IJ SOLN: 4.0000 mg | Freq: Three times a day (TID) | INTRAMUSCULAR | Status: DC | PRN

## 2015-06-13 MED ORDER — OXYCODONE-ACETAMINOPHEN 5-325 MG PO TABS
2.0000 | ORAL_TABLET | ORAL | Status: DC | PRN
  Administered 2015-06-13 – 2015-06-16 (×9): 2 via ORAL
  Filled 2015-06-13 (×9): qty 2

## 2015-06-13 MED ORDER — CEFAZOLIN SODIUM-DEXTROSE 2-3 GM-% IV SOLR
INTRAVENOUS | Status: AC
  Administered 2015-06-13: 2 g via INTRAVENOUS
  Filled 2015-06-13: qty 50

## 2015-06-13 MED ORDER — DIPHENHYDRAMINE HCL 25 MG PO CAPS: 25.0000 mg | ORAL_CAPSULE | Freq: Four times a day (QID) | ORAL | Status: DC | PRN

## 2015-06-13 MED ORDER — FENTANYL CITRATE (PF) 100 MCG/2ML IJ SOLN
INTRAMUSCULAR | Status: DC | PRN
  Administered 2015-06-13: 10 ug via INTRAVENOUS

## 2015-06-13 SURGICAL SUPPLY — 26 items
BAG COUNTER SPONGE EZ (MISCELLANEOUS) ×2 IMPLANT
CANISTER SUCT 3000ML (MISCELLANEOUS) ×2 IMPLANT
CHLORAPREP W/TINT 26ML (MISCELLANEOUS) ×4 IMPLANT
DRSG OPSITE POSTOP 4X8 (GAUZE/BANDAGES/DRESSINGS) ×2 IMPLANT
DRSG TELFA 3X8 NADH (GAUZE/BANDAGES/DRESSINGS) ×2 IMPLANT
GAUZE SPONGE 4X4 12PLY STRL (GAUZE/BANDAGES/DRESSINGS) ×2 IMPLANT
GLOVE BIO SURGEON STRL SZ 6 (GLOVE) ×8 IMPLANT
GLOVE BIOGEL PI IND STRL 6.5 (GLOVE) ×1 IMPLANT
GLOVE BIOGEL PI INDICATOR 6.5 (GLOVE) ×1
GOWN STRL REUS W/ TWL LRG LVL3 (GOWN DISPOSABLE) ×2 IMPLANT
GOWN STRL REUS W/TWL LRG LVL3 (GOWN DISPOSABLE) ×2
KIT RM TURNOVER STRD PROC AR (KITS) ×2 IMPLANT
LIQUID BAND (GAUZE/BANDAGES/DRESSINGS) ×2 IMPLANT
MARKER SKIN W/RULER 31145785 (MISCELLANEOUS) IMPLANT
NS IRRIG 1000ML POUR BTL (IV SOLUTION) ×2 IMPLANT
PACK C SECTION AR (MISCELLANEOUS) ×2 IMPLANT
PAD GROUND ADULT SPLIT (MISCELLANEOUS) ×2 IMPLANT
PAD OB MATERNITY 4.3X12.25 (PERSONAL CARE ITEMS) ×2 IMPLANT
PAD PREP 24X41 OB/GYN DISP (PERSONAL CARE ITEMS) ×2 IMPLANT
SUT CHROMIC 0 CT 1 (SUTURE) IMPLANT
SUT MNCRL AB 4-0 PS2 18 (SUTURE) ×2 IMPLANT
SUT PLAIN 2 0 XLH (SUTURE) IMPLANT
SUT VIC AB 0 CT1 36 (SUTURE) ×4 IMPLANT
SUT VIC AB 1 CT1 36 (SUTURE) ×4 IMPLANT
SUT VIC AB 3-0 SH 27 (SUTURE) ×1
SUT VIC AB 3-0 SH 27X BRD (SUTURE) ×1 IMPLANT

## 2015-06-13 NOTE — Transfer of Care (Signed)
Immediate Anesthesia Transfer of Care Note  Patient: Barbara Mendez  Procedure(s) Performed: Procedure(s): CESAREAN SECTION (N/A)  Patient Location: PACU  Anesthesia Type:General  Level of Consciousness: sedated  Airway & Oxygen Therapy: Patient Spontanous Breathing and Patient connected to face mask oxygen  Post-op Assessment: Report given to RN and Post -op Vital signs reviewed and stable  Post vital signs: Reviewed and stable  Last Vitals:  Filed Vitals:   06/13/15 0920  BP: 104/67  Pulse: 68  Temp: 36.4 C  Resp: 12    Complications: No apparent anesthesia complications

## 2015-06-13 NOTE — Op Note (Signed)
Cesarean Section Procedure Note  Indications: previous uterine incision low transverse  Pre-operative Diagnosis: 39 week 3 day pregnancy, h/o prior C-section x 1, suspected SGA infant.  Post-operative Diagnosis: Same  Surgeon: Hildred Laser, MD  Assistants: Posey Rea, Surgical Scrub Technician  Procedure: Primary/Repeat low transverse Cesarean Section  Anesthesia: Spinal anesthesia  ASA Class: II  Procedure Details: The patient was seen in the Holding Room. The risks, benefits, complications, treatment options, and expected outcomes were discussed with the patient.  The patient concurred with the proposed plan, giving informed consent.  The site of surgery properly noted/marked. The patient was taken to the Operating Room, identified as Barbara Mendez and the procedure verified as C-Section Delivery. A Time Out was held and the above information confirmed.  After induction of anesthesia, the patient was draped and prepped in the usual sterile manner. Anesthesia was tested and noted to be adequate. A Pfannenstiel incision was made and carried down through the subcutaneous tissue to the fascia. Fascial incision was made and extended transversely. The fascia was separated from the underlying rectus tissue superiorly and inferiorly. The peritoneum was identified and entered. Peritoneal incision was extended longitudinally. The utero-vesical peritoneal reflection was incised transversely and the bladder flap was bluntly freed from the lower uterine segment. A low transverse uterine incision was made. Delivered from cephalic presentation was a 2710 gram female with Apgar scores of 8 at one minute and 9 at five minutes.After the umbilical cord was clamped and cut cord blood was obtained for evaluation. The placenta was removed intact and appeared normal. The uterus was exteriorized and cleared of all clots and debris. The uterine outline, tubes and ovaries appeared normal.  The uterine incision was  closed with running locked sutures of 0-Vicryl.  A second suture of 0-Vicryl was used in an imbricating layer.  Hemostasis was observed. Lavage was carried out until clear. The fascia was then reapproximated with a running suture of 1-0 Vicryl. The subcutaneous fat layer was reapproximated with 3-0 Vicryl. skin was reapproximated with 4-0 Monocryl.  Liquiband was placed over the incision.   Instrument, sponge, and needle counts were correct prior the abdominal closure and at the conclusion of the case.   Findings: Female infant, cephalic presentation, 2710 grams, with Apgar scores of 8 at one minute and 9 at five minutes. Intact placenta with 3 vessel cord.  The uterine outline, tubes and ovaries appeared normal.   Estimated Blood Loss:  600 ml      Drains: foley catheter to gravity drainage, 200 ml clear urine at end of the procedure         Total IV Fluids:  1400 ml  Specimens: Placenta and Disposition:  Sent to Pathology         Implants: None         Complications:  None; patient tolerated the procedure well.         Disposition: PACU - hemodynamically stable.         Condition: stable   Hildred Laser, MD [facility]

## 2015-06-13 NOTE — Anesthesia Preprocedure Evaluation (Signed)
Anesthesia Evaluation  Patient identified by MRN, date of birth, ID band Patient awake    Reviewed: Allergy & Precautions, H&P , NPO status , Patient's Chart, lab work & pertinent test results, reviewed documented beta blocker date and time   History of Anesthesia Complications Negative for: history of anesthetic complications  Airway Mallampati: II  TM Distance: >3 FB Neck ROM: full    Dental no notable dental hx. (+) Teeth Intact   Pulmonary neg sleep apnea, neg recent URI, former smoker,    Pulmonary exam normal breath sounds clear to auscultation       Cardiovascular Exercise Tolerance: Good negative cardio ROS Normal cardiovascular exam Rhythm:regular Rate:Normal     Neuro/Psych negative neurological ROS  negative psych ROS   GI/Hepatic negative GI ROS, Neg liver ROS,   Endo/Other  negative endocrine ROS  Renal/GU negative Renal ROS  negative genitourinary   Musculoskeletal   Abdominal   Peds  Hematology negative hematology ROS (+)   Anesthesia Other Findings Past Medical History:   Hypotension                                                  Reproductive/Obstetrics negative OB ROS                             Anesthesia Physical Anesthesia Plan  ASA: II  Anesthesia Plan: Spinal   Post-op Pain Management:    Induction:   Airway Management Planned:   Additional Equipment:   Intra-op Plan:   Post-operative Plan:   Informed Consent: I have reviewed the patients History and Physical, chart, labs and discussed the procedure including the risks, benefits and alternatives for the proposed anesthesia with the patient or authorized representative who has indicated his/her understanding and acceptance.   Dental Advisory Given  Plan Discussed with: Anesthesiologist, CRNA and Surgeon  Anesthesia Plan Comments:         Anesthesia Quick Evaluation

## 2015-06-13 NOTE — Anesthesia Procedure Notes (Signed)
Spinal Patient location during procedure: OR Start time: 06/13/2015 7:44 AM End time: 06/13/2015 7:49 AM Staffing Anesthesiologist: Martha Clan Resident/CRNA: POPE, KIMBERLY Performed by: anesthesiologist  Preanesthetic Checklist Completed: patient identified, site marked, surgical consent, pre-op evaluation, timeout performed, IV checked, risks and benefits discussed and monitors and equipment checked Spinal Block Patient position: sitting Prep: Betadine Patient monitoring: heart rate, continuous pulse ox, blood pressure and cardiac monitor Approach: midline Location: L4-5 Injection technique: single-shot Needle Needle type: Whitacre and Introducer  Needle gauge: 25 G Needle length: 9 cm Additional Notes Negative paresthesia. Negative blood return. Positive free-flowing CSF. Expiration date of kit checked and confirmed. Patient tolerated procedure well, without complications.

## 2015-06-13 NOTE — H&P (Signed)
UPDATE TO PREVIOUS HISTORY AND PHYSICAL  The patient has been seen and examined.  H&P is up to date, no changes noted. She is a 25 y.o. G70P1011 female at [redacted]w[redacted]d who is presenting for scheduled repeat C-section (h/o prior C-section x 1, declined TOLAC).  Patient can proceed to the OR for scheduled procedure.   Hildred Laser, MD 06/13/2015 7:16 AM

## 2015-06-14 ENCOUNTER — Encounter: Payer: Self-pay | Admitting: Obstetrics and Gynecology

## 2015-06-14 LAB — CBC
HEMATOCRIT: 28.1 % — AB (ref 35.0–47.0)
HEMOGLOBIN: 9 g/dL — AB (ref 12.0–16.0)
MCH: 26.9 pg (ref 26.0–34.0)
MCHC: 31.9 g/dL — AB (ref 32.0–36.0)
MCV: 84.3 fL (ref 80.0–100.0)
Platelets: 205 10*3/uL (ref 150–440)
RBC: 3.33 MIL/uL — ABNORMAL LOW (ref 3.80–5.20)
RDW: 20.2 % — AB (ref 11.5–14.5)
WBC: 13.1 10*3/uL — AB (ref 3.6–11.0)

## 2015-06-14 NOTE — Progress Notes (Signed)
Postpartum Day 1: Cesarean Delivery  Subjective:  Patient reports tolerating PO and no problems voiding.  Pain well controlled. + flatus.  Objective: Vital signs in last 24 hours: Temp:  [97.5 F (36.4 C)-98.6 F (37 C)] 98.2 F (36.8 C) (09/20 0305) Pulse Rate:  [48-169] 71 (09/20 0305) Resp:  [12-20] 18 (09/20 0305) BP: (97-157)/(53-125) 112/78 mmHg (09/20 0305) SpO2:  [98 %-100 %] 98 % (09/19 1312)  Physical Exam:  General: alert and no distress Lochia: appropriate Uterine Fundus: firm Incision: healing well, no significant drainage, no dehiscence, no significant erythema DVT Evaluation: No evidence of DVT seen on physical exam. No cords or calf tenderness. No significant calf/ankle edema.  Recent Labs CBC Latest Ref Rng 06/14/2015 06/10/2015  WBC 3.6 - 11.0 K/uL 13.1(H) 11.1(H)  Hemoglobin 12.0 - 16.0 g/dL 9.0(L) 10.0(L)  Hematocrit 35.0 - 47.0 % 28.1(L) 32.2(L)  Platelets 150 - 440 K/uL 205 265     Assessment/Plan: Status post Cesarean section. Doing well postoperatively.  Bottle feeding. Contraception: Desires OCPs postpartum. Anemia - of pregnancy.  Continue iron supplementation postpartum.   Hildred Laser 06/14/2015, 7:44 AM

## 2015-06-14 NOTE — Anesthesia Post-op Follow-up Note (Cosign Needed)
  Anesthesia Pain Follow-up Note  Patient: Barbara Mendez  Day #: 1  Date of Follow-up: 06/14/2015 Time: 7:45 AM  Last Vitals:  Filed Vitals:   06/14/15 0305  BP: 112/78  Pulse: 71  Temp: 36.8 C  Resp: 18    Level of Consciousness: alert  Pain: none   Side Effects:None  Catheter Site Exam:clean, dry, no drainage  Plan: Continue current therapy  Noles,  Sheran Fava

## 2015-06-15 LAB — TYPE AND SCREEN
ABO/RH(D): O POS
Antibody Screen: NEGATIVE

## 2015-06-15 MED ORDER — OXYCODONE-ACETAMINOPHEN 5-325 MG PO TABS: 1.0000 | ORAL_TABLET | Freq: Four times a day (QID) | ORAL | Status: DC | PRN

## 2015-06-15 MED ORDER — DOCUSATE SODIUM 100 MG PO CAPS: 100.0000 mg | ORAL_CAPSULE | Freq: Two times a day (BID) | ORAL | Status: DC | PRN

## 2015-06-15 MED ORDER — IBUPROFEN 800 MG PO TABS: 800.0000 mg | ORAL_TABLET | Freq: Four times a day (QID) | ORAL | Status: DC

## 2015-06-15 NOTE — Progress Notes (Signed)
Postpartum Day 2: Cesarean Delivery  Subjective:  Patient reports tolerating PO and no problems voiding.  Pain well controlled. + flatus.  Ambulating without difficulty.  Objective: Filed Vitals:   06/14/15 1136 06/14/15 1631 06/14/15 2049 06/15/15 0357  BP: 108/71 106/64 120/73   Pulse: 61 59 64   Temp: 98.7 F (37.1 C) 97.9 F (36.6 C) 98.4 F (36.9 C) 98.5 F (36.9 C)  TempSrc: Oral Oral Oral Oral  Resp: Height:      Weight:      SpO2:  100% 100%     Physical Exam:  General: alert and no distress Lochia: appropriate Uterine Fundus: firm Incision: healing well, no significant drainage, no dehiscence, no significant erythema. Honeycomb dressing intact. DVT Evaluation: No evidence of DVT seen on physical exam. No cords or calf tenderness. No significant calf/ankle edema.  Recent Labs CBC Latest Ref Rng 06/14/2015 06/10/2015  WBC 3.6 - 11.0 K/uL 13.1(H) 11.1(H)  Hemoglobin 12.0 - 16.0 g/dL 9.0(L) 10.0(L)  Hematocrit 35.0 - 47.0 % 28.1(L) 32.2(L)  Platelets 150 - 440 K/uL 205 265     Assessment/Plan: Status post Cesarean section. Doing well postoperatively.  Bottle feeding. Contraception: Desires OCPs postpartum. Anemia - of pregnancy.  Continue iron supplementation postpartum.  Dispo: likely d/c home tomorrow.   Hildred Laser 06/15/2015, 8:10 AM

## 2015-06-15 NOTE — Discharge Summary (Signed)
Obstetric Discharge Summary Reason for Admission: cesarean section Prenatal Procedures: ultrasound Intrapartum Procedures: cesarean: low cervical, transverse Postpartum Procedures: none Complications-Operative and Postpartum: none HEMOGLOBIN  Date Value Ref Range Status  06/14/2015 9.0* 12.0 - 16.0 g/dL Final  16/06/9603 54.0 g/dL Final   HGB  Date Value Ref Range Status  08/10/2014 11.6* 12.0-16.0 g/dL Final   HCT  Date Value Ref Range Status  06/14/2015 28.1* 35.0 - 47.0 % Final  12/16/2014 34 % Final  08/10/2014 36.4 35.0-47.0 % Final   HEMATOCRIT  Date Value Ref Range Status  04/07/2015 27.6* 34.0 - 46.6 % Final    Physical Exam:  General: alert and no distress Lochia: appropriate Uterine Fundus: firm Incision: healing well, no significant drainage, no dehiscence, no significant erythema DVT Evaluation: No evidence of DVT seen on physical exam. Negative Homan's sign. No cords or calf tenderness. No significant calf/ankle edema.  Discharge Diagnoses: Term Pregnancy-delivered  Discharge Information: Date: 06/15/2015 Activity: pelvic rest Diet: routine Medications: PNV, Ibuprofen, Colace, Iron and Percocet Condition: stable Instructions: refer to practice specific booklet Discharge to: home Follow-up Information    Follow up with Hildred Laser, MD In 1 week.   Specialties:  Obstetrics and Gynecology, Radiology   Why:  For wound re-check   Contact information:   [address] [place] [address] [phone]       Newborn Data: Live born female  Birth Weight: 5 lb 15.6 oz (2710 g) APGAR: 8, 9  Home with mother.  Hildred Laser 06/15/2015, 8:19 AM

## 2015-06-15 NOTE — Anesthesia Postprocedure Evaluation (Signed)
  Anesthesia Post-op Note  Patient: Barbara Mendez  Procedure(s) Performed: Procedure(s): CESAREAN SECTION (N/A)  Anesthesia type:Spinal  Patient location: Mother Baby 341  Post pain: Pain level controlled  Post assessment: Post-op Vital signs reviewed, Patient's Cardiovascular Status Stable, Respiratory Function Stable, Patent Airway and No signs of Nausea or vomiting  Post vital signs: Reviewed and stable  Last Vitals:  Filed Vitals:   06/15/15 0357  BP:   Pulse:   Temp: 36.9 C  Resp:     Level of consciousness: awake, alert  and patient cooperative  Complications: No apparent anesthesia complications

## 2015-06-15 NOTE — Discharge Instructions (Signed)
General Postpartum Discharge Instructions  Do not drink alcohol or take tranquilizers.  Do not take medicine that has not been prescribed by your doctor.  Take showers instead of baths until your doctor gives you permission to take baths.  No sexual intercourse or placement of anything in the vagina for 6 weeks or as instructed by your doctor. Only take prescription or over-the-counter medicines  for pain, discomfort, or fever as directed by your doctor. Take medicines (antibiotics) that kill germs if they are prescribed for you.   Call the office or go to the Emergency Room if:  You feel sick to your stomach (nauseous).  You start to throw up (vomit).  You have trouble eating or drinking.  You have an oral temperature above 101.  You have constipation that is not helped by adjusting diet or increasing fluid intake. Pain medicines are a common cause of constipation.  You have foul smelling vaginal discharge or odor.  You have bleeding requiring changing more than 1 pad per hour. You have any other concerns regarding your incision.  SEEK IMMEDIATE MEDICAL CARE IF:  You have persistent dizziness.  You have difficulty breathing or shortness of breath.  You have an oral temperature above 102.5, not controlled by medicine.

## 2015-06-16 NOTE — Progress Notes (Signed)
Postpartum Day 3: Cesarean Delivery  Subjective:  Patient reports tolerating PO and no problems voiding.  Complains of back pain (thinks it's musculoskeletal). + flatus and BM.  Ambulating without difficulty.  Objective: Filed Vitals:   06/15/15 1921 06/15/15 1924 06/16/15 0016 06/16/15 0345  BP: 108/60 115/76    Pulse: 61 71    Temp: 98 F (36.7 C) 98 F (36.7 C) 98.4 F (36.9 C) 98.3 F (36.8 C)  TempSrc: Oral Oral    Resp: 18 20    Height:      Weight:      SpO2:        Physical Exam:  General: alert and no distress Lochia: appropriate Uterine Fundus: firm Incision: healing well, no significant drainage, no dehiscence, no significant erythema. Honeycomb dressing intact. DVT Evaluation: No evidence of DVT seen on physical exam. No cords or calf tenderness. No significant calf/ankle edema.  Recent Labs CBC Latest Ref Rng 06/14/2015 06/10/2015  WBC 3.6 - 11.0 K/uL 13.1(H) 11.1(H)  Hemoglobin 12.0 - 16.0 g/dL 9.0(L) 10.0(L)  Hematocrit 35.0 - 47.0 % 28.1(L) 32.2(L)  Platelets 150 - 440 K/uL 205 265     Assessment/Plan: Status post Cesarean section. Doing well postoperatively.  Bottle feeding. Contraception: Desires OCPs postpartum. Anemia - of pregnancy.  Continue iron supplementation postpartum.  Heating pad and NSAIDs for back pain.  Dispo: d/c home today.   Hildred Laser 06/16/2015, 8:05 AM

## 2015-06-16 NOTE — Progress Notes (Signed)
Patient showed understanding of discharge instructions. Stable condition and discharged home.

## 2015-06-17 NOTE — Anesthesia Post-op Follow-up Note (Signed)
  Anesthesia Pain Follow-up Note  Patient: Barbara Mendez  Day #: 1  Date of Follow-up: 06/17/2015 Time: 8:51 AM This is an edit of the follow up done on post op day 1 on 06/14/15 to include a co signer that was left off.    Last Vitals:  Filed Vitals:   06/16/15 1220  BP:   Pulse:   Temp: 36.7 C  Resp:     Level of Consciousness: alert  Pain: none   Side Effects:None  Catheter Site Exam:clean, dry, no drainage  Plan: Continue current therapy  Paislea Hatton,  Sheran Fava

## 2015-07-21 ENCOUNTER — Ambulatory Visit (INDEPENDENT_AMBULATORY_CARE_PROVIDER_SITE_OTHER): Payer: Medicaid Other | Admitting: Obstetrics and Gynecology

## 2015-07-21 ENCOUNTER — Encounter: Payer: Self-pay | Admitting: Obstetrics and Gynecology

## 2015-07-21 DIAGNOSIS — O99019 Anemia complicating pregnancy, unspecified trimester: Secondary | ICD-10-CM

## 2015-07-21 NOTE — Progress Notes (Addendum)
OBSTETRICS POSTPARTUM CLINIC PROGRESS NOTE   Subjective:     Barbara Mendez is a 24 y.o. [id number] female who presents for a postpartum visit. She is 6 weeks postpartum following a repeat low cervical transverse Cesarean section. I have fully reviewed the prenatal and intrapartum course. The delivery was at 39.3 gestational weeks. Anesthesia: spinal. Postpartum course has been well. Baby's course has been well. Baby is feeding by bottle. Bleeding no bleeding.  Has not resumed menses. Bowel function is normal. Bladder function is normal. Patient is sexually active (notes one time, earlier this week). Contraception method desired is IUD. Postpartum depression screening: negative.  The following portions of the patient's history were reviewed and updated as appropriate: allergies, current medications, past family history, past medical history, past social history, past surgical history and problem list.  Review of Systems Pertinent items noted in HPI and remainder of comprehensive ROS otherwise negative.   Objective:    BP 132/85 mmHg  Pulse 74  Resp 14  Ht 5\' 5"  (1.651 m)  Wt 152 lb 11.2 oz (69.264 kg)  BMI 25.41 kg/m2  Breastfeeding? No  General:  alert and no distress   Breasts:  inspection negative, no nipple discharge or bleeding, no masses or nodularity palpable  Lungs: clear to auscultation bilaterally  Heart:  regular rate and rhythm, S1, S2 normal, no murmur, click, rub or gallop  Abdomen: soft, non-tender; bowel sounds normal; no masses,  no organomegaly.  Incision well healed.    Vulva:  normal  Vagina: normal vagina, no discharge, exudate, lesion, or erythema  Cervix:  no lesions and nulliparous appearance  Corpus: normal size, contour, position, consistency, mobility, non-tender  Adnexa:  normal adnexa and no mass, fullness, tenderness  Rectal Exam: Not performed.         Lab Results  Component Value Date   HGB 9.0* 06/14/2015    Assessment:    Routine postpartum  exam. Pap smear not done at today's visit.  Anemia of pregnancy (asymptomatic) Plan:    1. Contraception: IUD desired.  To f/u in 2 weeks for IUD placement. Advised on abstinence or condom use until IUD placed.  2.  Will check Hgb today for h/o anemia.  Patient reports non-compliance with iron therapy.  3. Follow up in: 3 months for annual exam, or earlier as needed.     Hildred LaserAnika Junell Cullifer, MD [facility]

## 2015-08-09 ENCOUNTER — Ambulatory Visit (INDEPENDENT_AMBULATORY_CARE_PROVIDER_SITE_OTHER): Payer: Medicaid Other | Admitting: Obstetrics and Gynecology

## 2015-08-09 ENCOUNTER — Encounter: Payer: Self-pay | Admitting: Obstetrics and Gynecology

## 2015-08-09 VITALS — BP 117/83 | HR 106 | Ht 65.0 in | Wt 153.0 lb

## 2015-08-09 DIAGNOSIS — Z30430 Encounter for insertion of intrauterine contraceptive device: Secondary | ICD-10-CM | POA: Diagnosis not present

## 2015-08-09 DIAGNOSIS — Z975 Presence of (intrauterine) contraceptive device: Secondary | ICD-10-CM

## 2015-08-09 LAB — POCT URINE PREGNANCY: PREG TEST UR: NEGATIVE

## 2015-08-09 MED ORDER — LEVONORGESTREL 20 MCG/24HR IU IUD: 1.0000 | INTRAUTERINE_SYSTEM | Freq: Once | INTRAUTERINE | Status: DC

## 2015-08-09 NOTE — Patient Instructions (Signed)
IUD PLACEMENT POST-PROCEDURE INSTRUCTIONS  1. You may take Ibuprofen, Aleve or Tylenol for pain if needed.  Cramping should resolve within in 24 hours.  2. You may have a small amount of spotting.  You should wear a mini pad for the next few days.  3. You may have intercourse after 24 hours.  If you using this for birth control, it is effective immediately.  4. You need to call if you have any pelvic pain, fever, heavy bleeding or foul smelling vaginal discharge.  Irregular bleeding is common the first several months after having an IUD placed. You do not need to call for this reason unless you are concerned.  5. Shower or bathe as normal  6. You should have a follow-up appointment in 4-8 weeks for a re-check to make sure you are not having any problems.   Intrauterine Device Insertion, Care After Refer to this sheet in the next few weeks. These instructions provide you with information on caring for yourself after your procedure. Your health care provider may also give you more specific instructions. Your treatment has been planned according to current medical practices, but problems sometimes occur. Call your health care provider if you have any problems or questions after your procedure. WHAT TO EXPECT AFTER THE PROCEDURE Insertion of the IUD may cause some discomfort, such as cramping. The cramping should improve after the IUD is in place. You may have bleeding after the procedure. This is normal. It varies from light spotting for a few days to menstrual-like bleeding. When the IUD is in place, a string will extend past the cervix into the vagina for 1-2 inches. The strings should not bother you or your partner. If they do, talk to your health care provider.  HOME CARE INSTRUCTIONS   Check your intrauterine device (IUD) to make sure it is in place before you resume sexual activity. You should be able to feel the strings. If you cannot feel the strings, something may be wrong. The IUD may  have fallen out of the uterus, or the uterus may have been punctured (perforated) during placement. Also, if the strings are getting longer, it may mean that the IUD is being forced out of the uterus. You no longer have full protection from pregnancy if any of these problems occur.  You may resume sexual intercourse if you are not having problems with the IUD. The copper IUD is considered immediately effective, and the hormone IUD works right away if inserted within 7 days of your period starting. You will need to use a backup method of birth control for 7 days if the IUD in inserted at any other time in your cycle.  Continue to check that the IUD is still in place by feeling for the strings after every menstrual period.  You may need to take pain medicine such as acetaminophen or ibuprofen. Only take medicines as directed by your health care provider. SEEK MEDICAL CARE IF:   You have bleeding that is heavier or lasts longer than a normal menstrual cycle.  You have a fever.  You have increasing cramps or abdominal pain not relieved with medicine.  You have abdominal pain that does not seem to be related to the same area of earlier cramping and pain.  You are lightheaded, unusually weak, or faint.  You have abnormal vaginal discharge or smells.  You have pain during sexual intercourse.  You cannot feel the IUD strings, or the IUD string has gotten longer.  You feel  the IUD at the opening of the cervix in the vagina.  You think you are pregnant, or you miss your menstrual period.  The IUD string is hurting your sex partner. MAKE SURE YOU:  Understand these instructions.  Will watch your condition.  Will get help right away if you are not doing well or get worse.   This information is not intended to replace advice given to you by your health care provider. Make sure you discuss any questions you have with your health care provider.   Document Released: 05/09/2011 Document  Revised: 07/01/2013 Document Reviewed: 03/01/2013 Elsevier Interactive Patient Education [employer]2016 [employer] Inc[employer]

## 2015-08-09 NOTE — Progress Notes (Signed)
GYNECOLOGY CLINIC PROCEDURE NOTE  Barbara Mendez is a 24 y.o. [phone] here for Mirena IUD insertion. No GYN concerns.  Last pap smear was on 12/16/2014 and was normal.   Blood pressure 117/83, pulse 106, height 5\' 5"  (1.651 m), weight 153 lb (69.4 kg), last menstrual period 07/28/2015, not currently breastfeeding.  IUD Insertion Procedure Note Patient identified, informed consent performed, consent signed.   Discussed risks of irregular bleeding, cramping, infection, malpositioning or misplacement of the IUD outside the uterus which may require further procedure such as laparoscopy. Time out was performed.  Urine pregnancy test negative.  Speculum placed in the vagina.  Cervix visualized.  Cleaned with Betadine x 2.  Grasped anteriorly with a single tooth tenaculum.  Uterus sounded to 9 cm.  Mirena IUD placed per manufacturer's recommendations.  Strings trimmed to 3 cm. Tenaculum was removed, good hemostasis noted.  Patient tolerated procedure well.   Patient was given post-procedure instructions.  She was advised to have backup contraception for one week.  Patient was also asked to check IUD strings periodically and follow up in 4 weeks for IUD check.    Barbara LaserAnika Shin Lamour, MD [facility]

## 2015-09-06 ENCOUNTER — Ambulatory Visit: Payer: Medicaid Other | Admitting: Obstetrics and Gynecology

## 2015-09-15 ENCOUNTER — Telehealth: Payer: Self-pay | Admitting: Obstetrics and Gynecology

## 2015-09-15 NOTE — Telephone Encounter (Signed)
Pt states she had heavy bleeding the first 10 days after insertion. Since she has had lite bleeding or spotting Changing 2-3 x a day. Cramps x 1d. Advised this is normal with iud for first 6 months. Keep string check appt on 1-6. See her sooner if soaking a pad q 30 min to 1 hour or cramps not relieved with ibup.

## 2015-09-15 NOTE — Telephone Encounter (Signed)
PT GOT MIRENA ON 11/15, STARED HER PERIOD ON 11/27. SHE HAS BEEN BLEEDING EVER SINCE. SHE WANTS AN APPT TODAY.  [phone]

## 2015-09-30 ENCOUNTER — Encounter: Payer: Self-pay | Admitting: Obstetrics and Gynecology

## 2015-09-30 ENCOUNTER — Ambulatory Visit (INDEPENDENT_AMBULATORY_CARE_PROVIDER_SITE_OTHER): Payer: Medicaid Other | Admitting: Obstetrics and Gynecology

## 2015-09-30 VITALS — BP 128/85 | HR 93 | Ht 65.0 in | Wt 156.1 lb

## 2015-09-30 DIAGNOSIS — Z30431 Encounter for routine checking of intrauterine contraceptive device: Secondary | ICD-10-CM | POA: Diagnosis not present

## 2015-10-01 NOTE — Progress Notes (Signed)
    GYNECOLOGY CLINIC PROGRESS NOTE  History:  25 y.o. [id number] here today for today for IUD string check; Mirena IUD was placed 08/09/2015. Reports that she has been bleeding since IUD was placed, however has been decreasing in flow over the past week.  Nno concerning side effects.  The following portions of the patient's history were reviewed and updated as appropriate: allergies, current medications, past family history, past medical history, past social history, past surgical history and problem list. Last pap smear on 10/2014 was normal, negative HRHPV.  Review of Systems:  Pertinent items are noted in HPI.  Objective:  Physical Exam Blood pressure 128/85, pulse 93, height 5\' 5"  (1.651 m), weight 156 lb 1.6 oz (70.806 kg), last menstrual period 08/09/2015, not currently breastfeeding. CONSTITUTIONAL: Well-developed, well-nourished female in no acute distress.  ABDOMEN: Soft, no distention noted.   PELVIC: Normal appearing external genitalia; normal appearing vaginal mucosa and cervix.  IUD strings visualized, about 2 cm cm in length outside cervix.   Assessment & Plan:  Normal IUD check.  Reiterated that abnormal bleeding can occur during the first 3-6 months after IUD insertion.  Patient notes understanding. Patient to keep IUD in place for five years; can come in for removal if she desires pregnancy within the next five years. Routine preventative health maintenance measures emphasized.  For follow up in 3-6 months for annual exam.    Hildred LaserAnika Jamaurion Slemmer, MD [facility]

## 2016-03-29 ENCOUNTER — Encounter: Payer: Medicaid Other | Admitting: Obstetrics and Gynecology

## 2016-05-10 ENCOUNTER — Ambulatory Visit (INDEPENDENT_AMBULATORY_CARE_PROVIDER_SITE_OTHER): Payer: Self-pay | Admitting: Obstetrics and Gynecology

## 2016-05-10 ENCOUNTER — Encounter: Payer: Self-pay | Admitting: Obstetrics and Gynecology

## 2016-05-10 VITALS — BP 116/79 | HR 86 | Ht 65.0 in | Wt 152.6 lb

## 2016-05-10 DIAGNOSIS — Z30431 Encounter for routine checking of intrauterine contraceptive device: Secondary | ICD-10-CM

## 2016-05-10 DIAGNOSIS — Z0000 Encounter for general adult medical examination without abnormal findings: Secondary | ICD-10-CM

## 2016-05-10 DIAGNOSIS — Z01419 Encounter for gynecological examination (general) (routine) without abnormal findings: Secondary | ICD-10-CM

## 2016-05-10 DIAGNOSIS — E663 Overweight: Secondary | ICD-10-CM

## 2016-05-10 NOTE — Progress Notes (Signed)
GYNECOLOGY ANNUAL PHYSICAL EXAM PROGRESS NOTE  Subjective:    Barbara Mendez is a 25 y.o. [phone] female who presents for an annual exam. The patient has no complaints today. The patient is sexually active. The patient wears seatbelts: yes. The patient participates in regular exercise: no. Has the patient ever been transfused or tattooed?: not asked. The patient reports that there is not domestic violence in her life.    Gynecologic History Patient's last menstrual period was 04/09/2016 (exact date). Menstrual History: OB History    Gravida Para Term Preterm AB Living   3 2 2   1 2    SAB TAB Ectopic Multiple Live Births   1     0 2     Menarche age: [age] Patient's last menstrual period was 04/09/2016 (exact date). Contraception: Mirena IUD (inserted 08/09/2015). History of STI's: Denies Last Pap:  12/16/2014. Results were: normal.  Denies h/o abnormal pap smears.    Obstetric History   G3   P2   T2   P0   A1   L2    SAB0   TAB0   Ectopic0   Multiple0   Live Births2     # Outcome Date GA Lbr Len/2nd Weight Sex Delivery Anes PTL Lv  3 Term 06/13/15 [id number]  5 lb 15.6 oz (2.71 kg) F CS-LTranv Spinal  LIV     Name: Mendez,GIRL Eric     Apgar1:  8                Apgar5: 9  2 SAB 08/24/14     SAB   FD  1 Term 2010   8 lb (3.629 kg) F CS-LTranv  N LIV      Past Medical History:  Diagnosis Date  . Hypotension     Past Surgical History:  Procedure Laterality Date  . CESAREAN SECTION    . CESAREAN SECTION N/A 06/13/2015   Procedure: CESAREAN SECTION;  Surgeon: Hildred LaserAnika Sherylann Vangorden, MD;  Location: [facility] ORS;  Service: Obstetrics;  Laterality: N/A;    Family History  Problem Relation Age of Onset  . Hypertension Father     Social History   Social History  . Marital status: Single    Spouse name: N/A  . Number of children: N/A  . Years of education: N/A   Occupational History  . Not on file.   Social History Main Topics  . Smoking status: Former Smoker    Quit  date: 09/24/2014  . Smokeless tobacco: Never Used  . Alcohol use No  . Drug use: No  . Sexual activity: Yes    Birth control/ protection: None, IUD     Comment: mirena    Other Topics Concern  . Not on file   Social History Narrative  . No narrative on file    Current Outpatient Prescriptions on File Prior to Visit  Medication Sig Dispense Refill  . levonorgestrel (MIRENA) 20 MCG/24HR IUD 1 Intra Uterine Device (1 each total) by Intrauterine route once. 1 each 0   No current facility-administered medications on file prior to visit.     No Known Allergies  Review of Systems Constitutional: negative for chills, fatigue, fevers and sweats Eyes: negative for irritation, redness and visual disturbance Ears, nose, mouth, throat, and face: negative for hearing loss, nasal congestion, snoring and tinnitus Respiratory: negative for asthma, cough, sputum Cardiovascular: negative for chest pain, dyspnea, exertional chest pressure/discomfort, irregular heart beat, palpitations and syncope Gastrointestinal: negative for abdominal pain, change  in bowel habits, nausea and vomiting Genitourinary: occasional sharp pelvic pain, midline. Negative for abnormal menstrual periods, genital lesions, sexual problems and vaginal discharge, dysuria and urinary incontinence Integument/breast: negative for breast lump, breast tenderness and nipple discharge Hematologic/lymphatic: negative for bleeding and easy bruising Musculoskeletal:negative for back pain and muscle weakness Neurological: negative for dizziness, headaches, vertigo and weakness Endocrine: negative for diabetic symptoms including polydipsia, polyuria and skin dryness Allergic/Immunologic: negative for hay fever and urticaria      Objective:  Blood pressure 116/79, pulse 86, height 5\' 5"  (1.651 m), weight 152 lb 9.6 oz (69.2 kg), last menstrual period 04/09/2016, not currently breastfeeding. Body mass index is 25.39 kg/m.  General  Appearance:    Alert, cooperative, no distress, appears stated age, overweight  Head:    Normocephalic, without obvious abnormality, atraumatic  Eyes:    PERRL, conjunctiva/corneas clear, EOM's intact, both eyes  Ears:    Normal external ear canals, both ears  Nose:   Nares normal, septum midline, mucosa normal, no drainage or sinus tenderness  Throat:   Lips, mucosa, and tongue normal; teeth and gums normal  Neck:   Supple, symmetrical, trachea midline, no adenopathy; thyroid: no enlargement/tenderness/nodules; no carotid bruit or JVD  Back:     Symmetric, no curvature, ROM normal, no CVA tenderness  Lungs:     Clear to auscultation bilaterally, respirations unlabored  Chest Wall:    No tenderness or deformity   Heart:    Regular rate and rhythm, S1 and S2 normal, no murmur, rub or gallop  Breast Exam:    No tenderness, masses, or nipple abnormality  Abdomen:     Soft, non-tender, bowel sounds active all four quadrants, no masses, no organomegaly.    Genitalia:    Pelvic:external genitalia normal, vagina without lesions, discharge, or tenderness.  Dark red blood in vaginal vault.  Rectovaginal septum  normal. Cervix normal in appearance, no cervical motion tenderness, no adnexal masses or tenderness.  IUD strings visible, ~ 3 cm in length. Uterus normal size, shape, mobile, regular contours, nontender.  Rectal:    Normal external sphincter.  No hemorrhoids appreciated. Internal exam not done.   Extremities:   Extremities normal, atraumatic, no cyanosis or edema  Pulses:   2+ and symmetric all extremities  Skin:   Skin color, texture, turgor normal, no rashes or lesions  Lymph nodes:   Cervical, supraclavicular, and axillary nodes normal  Neurologic:   CNII-XII intact, normal strength, sensation and reflexes throughout   .  Labs:  Lab Results  Component Value Date   WBC 13.1 (H) 06/14/2015   HGB 9.0 (L) 06/14/2015   HCT 28.1 (L) 06/14/2015   MCV 84.3 06/14/2015   PLT 205 06/14/2015     Lab Results  Component Value Date   CREATININE 0.71 10/25/2013   BUN 11 10/25/2013   NA 136 10/25/2013   K 3.6 10/25/2013   CL 104 10/25/2013   CO2 25 10/25/2013    Lab Results  Component Value Date   ALT 15 10/25/2013   AST 17 10/25/2013   ALKPHOS 52 10/25/2013   BILITOT 0.3 10/25/2013    No results found for: TSH   Assessment:    Healthy female exam.   Overweight  Plan:    Blood tests: Basic metabolic panel and CBC with diff. Breast self exam technique reviewed and patient encouraged to perform self-exam monthly. Contraception: Mirena IUD.  Discussed this may be the cause of mild intermittent pelvic pain. Can take OTC pain meds  as needed.  Discussed healthy lifestyle modifications. Pap smear up to date.  Declined STD screening. Declined Gardasil vaccine. Follow up in 1 year.     Hildred Laser, MD [facility]

## 2016-05-10 NOTE — Patient Instructions (Signed)
Health Maintenance, Female Adopting a healthy lifestyle and getting preventive care can go a long way to promote health and wellness. Talk with your health care provider about what schedule of regular examinations is right for you. This is a good chance for you to check in with your provider about disease prevention and staying healthy. In between checkups, there are plenty of things you can do on your own. Experts have done a lot of research about which lifestyle changes and preventive measures are most likely to keep you healthy. Ask your health care provider for more information. WEIGHT AND DIET  Eat a healthy diet  Be sure to include plenty of vegetables, fruits, low-fat dairy products, and lean protein.  Do not eat a lot of foods high in solid fats, added sugars, or salt.  Get regular exercise. This is one of the most important things you can do for your health.  Most adults should exercise for at least 150 minutes each week. The exercise should increase your heart rate and make you sweat (moderate-intensity exercise).  Most adults should also do strengthening exercises at least twice a week. This is in addition to the moderate-intensity exercise.  Maintain a healthy weight  Body mass index (BMI) is a measurement that can be used to identify possible weight problems. It estimates body fat based on height and weight. Your health care provider can help determine your BMI and help you achieve or maintain a healthy weight.  For females 20 years of age and older:   A BMI below 18.5 is considered underweight.  A BMI of 18.5 to 24.9 is normal.  A BMI of 25 to 29.9 is considered overweight.  A BMI of 30 and above is considered obese.  Watch levels of cholesterol and blood lipids  You should start having your blood tested for lipids and cholesterol at 27 years of age, then have this test every 5 years.  You may need to have your cholesterol levels checked more often if:  Your lipid  or cholesterol levels are high.  You are older than 27 years of age.  You are at high risk for heart disease.  CANCER SCREENING   Lung Cancer  Lung cancer screening is recommended for adults 55-80 years old who are at high risk for lung cancer because of a history of smoking.  A yearly low-dose CT scan of the lungs is recommended for people who:  Currently smoke.  Have quit within the past 15 years.  Have at least a 30-pack-year history of smoking. A pack year is smoking an average of one pack of cigarettes a day for 1 year.  Yearly screening should continue until it has been 15 years since you quit.  Yearly screening should stop if you develop a health problem that would prevent you from having lung cancer treatment.  Breast Cancer  Practice breast self-awareness. This means understanding how your breasts normally appear and feel.  It also means doing regular breast self-exams. Let your health care provider know about any changes, no matter how small.  If you are in your 20s or 30s, you should have a clinical breast exam (CBE) by a health care provider every 1-3 years as part of a regular health exam.  If you are 40 or older, have a CBE every year. Also consider having a breast X-ray (mammogram) every year.  If you have a family history of breast cancer, talk to your health care provider about genetic screening.  If you   are at high risk for breast cancer, talk to your health care provider about having an MRI and a mammogram every year.  Breast cancer gene (BRCA) assessment is recommended for women who have family members with BRCA-related cancers. BRCA-related cancers include:  Breast.  Ovarian.  Tubal.  Peritoneal cancers.  Results of the assessment will determine the need for genetic counseling and BRCA1 and BRCA2 testing. Cervical Cancer Your health care provider may recommend that you be screened regularly for cancer of the pelvic organs (ovaries, uterus, and  vagina). This screening involves a pelvic examination, including checking for microscopic changes to the surface of your cervix (Pap test). You may be encouraged to have this screening done every 3 years, beginning at age 21.  For women ages 30-65, health care providers may recommend pelvic exams and Pap testing every 3 years, or they may recommend the Pap and pelvic exam, combined with testing for human papilloma virus (HPV), every 5 years. Some types of HPV increase your risk of cervical cancer. Testing for HPV may also be done on women of any age with unclear Pap test results.  Other health care providers may not recommend any screening for nonpregnant women who are considered low risk for pelvic cancer and who do not have symptoms. Ask your health care provider if a screening pelvic exam is right for you.  If you have had past treatment for cervical cancer or a condition that could lead to cancer, you need Pap tests and screening for cancer for at least 20 years after your treatment. If Pap tests have been discontinued, your risk factors (such as having a new sexual partner) need to be reassessed to determine if screening should resume. Some women have medical problems that increase the chance of getting cervical cancer. In these cases, your health care provider may recommend more frequent screening and Pap tests. Colorectal Cancer  This type of cancer can be detected and often prevented.  Routine colorectal cancer screening usually begins at 27 years of age and continues through 27 years of age.  Your health care provider may recommend screening at an earlier age if you have risk factors for colon cancer.  Your health care provider may also recommend using home test kits to check for hidden blood in the stool.  A small camera at the end of a tube can be used to examine your colon directly (sigmoidoscopy or colonoscopy). This is done to check for the earliest forms of colorectal  cancer.  Routine screening usually begins at age 50.  Direct examination of the colon should be repeated every 5-10 years through 27 years of age. However, you may need to be screened more often if early forms of precancerous polyps or small growths are found. Skin Cancer  Check your skin from head to toe regularly.  Tell your health care provider about any new moles or changes in moles, especially if there is a change in a mole's shape or color.  Also tell your health care provider if you have a mole that is larger than the size of a pencil eraser.  Always use sunscreen. Apply sunscreen liberally and repeatedly throughout the day.  Protect yourself by wearing long sleeves, pants, a wide-brimmed hat, and sunglasses whenever you are outside. HEART DISEASE, DIABETES, AND HIGH BLOOD PRESSURE   High blood pressure causes heart disease and increases the risk of stroke. High blood pressure is more likely to develop in:  People who have blood pressure in the high end   of the normal range (130-139/85-89 mm Hg).  People who are overweight or obese.  People who are African American.  If you are 38-23 years of age, have your blood pressure checked every 3-5 years. If you are 61 years of age or older, have your blood pressure checked every year. You should have your blood pressure measured twice--once when you are at a hospital or clinic, and once when you are not at a hospital or clinic. Record the average of the two measurements. To check your blood pressure when you are not at a hospital or clinic, you can use:  An automated blood pressure machine at a pharmacy.  A home blood pressure monitor.  If you are between 45 years and 39 years old, ask your health care provider if you should take aspirin to prevent strokes.  Have regular diabetes screenings. This involves taking a blood sample to check your fasting blood sugar level.  If you are at a normal weight and have a low risk for diabetes,  have this test once every three years after 27 years of age.  If you are overweight and have a high risk for diabetes, consider being tested at a younger age or more often. PREVENTING INFECTION  Hepatitis B  If you have a higher risk for hepatitis B, you should be screened for this virus. You are considered at high risk for hepatitis B if:  You were born in a country where hepatitis B is common. Ask your health care provider which countries are considered high risk.  Your parents were born in a high-risk country, and you have not been immunized against hepatitis B (hepatitis B vaccine).  You have HIV or AIDS.  You use needles to inject street drugs.  You live with someone who has hepatitis B.  You have had sex with someone who has hepatitis B.  You get hemodialysis treatment.  You take certain medicines for conditions, including cancer, organ transplantation, and autoimmune conditions. Hepatitis C  Blood testing is recommended for:  Everyone born from [birth year] through 1967.  Anyone with known risk factors for hepatitis C. Sexually transmitted infections (STIs)  You should be screened for sexually transmitted infections (STIs) including gonorrhea and chlamydia if:  You are sexually active and are younger than 27 years of age.  You are older than 27 years of age and your health care provider tells you that you are at risk for this type of infection.  Your sexual activity has changed since you were last screened and you are at an increased risk for chlamydia or gonorrhea. Ask your health care provider if you are at risk.  If you do not have HIV, but are at risk, it may be recommended that you take a prescription medicine daily to prevent HIV infection. This is called pre-exposure prophylaxis (PrEP). You are considered at risk if:  You are sexually active and do not regularly use condoms or know the HIV status of your partner(s).  You take drugs by injection.  You are sexually  active with a partner who has HIV. Talk with your health care provider about whether you are at high risk of being infected with HIV. If you choose to begin PrEP, you should first be tested for HIV. You should then be tested every 3 months for as long as you are taking PrEP.  PREGNANCY   If you are premenopausal and you may become pregnant, ask your health care provider about preconception counseling.  If you may  become pregnant, take 400 to 800 micrograms (mcg) of folic acid every day.  If you want to prevent pregnancy, talk to your health care provider about birth control (contraception). OSTEOPOROSIS AND MENOPAUSE   Osteoporosis is a disease in which the bones lose minerals and strength with aging. This can result in serious bone fractures. Your risk for osteoporosis can be identified using a bone density scan.  If you are 61 years of age or older, or if you are at risk for osteoporosis and fractures, ask your health care provider if you should be screened.  Ask your health care provider whether you should take a calcium or vitamin D supplement to lower your risk for osteoporosis.  Menopause may have certain physical symptoms and risks.  Hormone replacement therapy may reduce some of these symptoms and risks. Talk to your health care provider about whether hormone replacement therapy is right for you.  HOME CARE INSTRUCTIONS   Schedule regular health, dental, and eye exams.  Stay current with your immunizations.   Do not use any tobacco products including cigarettes, chewing tobacco, or electronic cigarettes.  If you are pregnant, do not drink alcohol.  If you are breastfeeding, limit how much and how often you drink alcohol.  Limit alcohol intake to no more than 1 drink per day for nonpregnant women. One drink equals 12 ounces of beer, 5 ounces of wine, or 1 ounces of hard liquor.  Do not use street drugs.  Do not share needles.  Ask your health care provider for help if  you need support or information about quitting drugs.  Tell your health care provider if you often feel depressed.  Tell your health care provider if you have ever been abused or do not feel safe at home.   This information is not intended to replace advice given to you by your health care provider. Make sure you discuss any questions you have with your health care provider.   Document Released: 03/26/2011 Document Revised: 10/01/2014 Document Reviewed: 08/12/2013 Elsevier Interactive Patient Education [employer].

## 2016-05-11 LAB — BASIC METABOLIC PANEL
BUN / CREAT RATIO: 13 (ref 9–23)
BUN: 8 mg/dL (ref 6–20)
CO2: 19 mmol/L (ref 18–29)
Calcium: 9.2 mg/dL (ref 8.7–10.2)
Chloride: 101 mmol/L (ref 96–106)
Creatinine, Ser: 0.64 mg/dL (ref 0.57–1.00)
GFR, EST AFRICAN AMERICAN: 142 mL/min/{1.73_m2} (ref >59)
GFR, EST NON AFRICAN AMERICAN: 124 mL/min/{1.73_m2} (ref >59)
Glucose: 80 mg/dL (ref 65–99)
POTASSIUM: 4 mmol/L (ref 3.5–5.2)
SODIUM: 137 mmol/L (ref 134–144)

## 2016-05-11 LAB — CBC
HEMATOCRIT: 39.2 % (ref 34.0–46.6)
Hemoglobin: 13.1 g/dL (ref 11.1–15.9)
MCH: 28.5 pg (ref 26.6–33.0)
MCHC: 33.4 g/dL (ref 31.5–35.7)
MCV: 85 fL (ref 79–97)
Platelets: 248 10*3/uL (ref 150–379)
RBC: 4.6 x10E6/uL (ref 3.77–5.28)
RDW: 16.7 % — AB (ref 12.3–15.4)
WBC: 5.8 10*3/uL (ref 3.4–10.8)

## 2016-05-14 ENCOUNTER — Telehealth: Payer: Self-pay

## 2016-05-14 NOTE — Telephone Encounter (Signed)
-----   Message from Anika Cherry, MD sent at 05/11/2016  3:18 PM EDT ----- Please inform of normal annual labs. 

## 2016-05-14 NOTE — Telephone Encounter (Signed)
Pt informed

## 2016-05-14 NOTE — Telephone Encounter (Signed)
-----   Message from Hildred LaserAnika Cherry, MD sent at 05/11/2016  3:18 PM EDT ----- Please inform of normal annual labs.

## 2017-01-28 ENCOUNTER — Emergency Department
Admission: EM | Admit: 2017-01-28 | Discharge: 2017-01-28 | Disposition: A | Discharge status: Home / Self Care | Payer: Medicaid Other | Attending: Emergency Medicine | Admitting: Emergency Medicine

## 2017-01-28 DIAGNOSIS — R112 Nausea with vomiting, unspecified: Secondary | ICD-10-CM | POA: Insufficient documentation

## 2017-01-28 DIAGNOSIS — Z5321 Procedure and treatment not carried out due to patient leaving prior to being seen by health care provider: Secondary | ICD-10-CM | POA: Insufficient documentation

## 2017-01-28 DIAGNOSIS — Z87891 Personal history of nicotine dependence: Secondary | ICD-10-CM | POA: Insufficient documentation

## 2017-01-28 LAB — COMPREHENSIVE METABOLIC PANEL
ALT: 11 U/L — ABNORMAL LOW (ref 14–54)
AST: 20 U/L (ref 15–41)
Albumin: 4 g/dL (ref 3.5–5.0)
Alkaline Phosphatase: 59 U/L (ref 38–126)
Anion gap: 10 (ref 5–15)
BUN: 13 mg/dL (ref 6–20)
CO2: 22 mmol/L (ref 22–32)
Calcium: 8.8 mg/dL — ABNORMAL LOW (ref 8.9–10.3)
Chloride: 100 mmol/L — ABNORMAL LOW (ref 101–111)
Creatinine, Ser: 0.85 mg/dL (ref 0.44–1.00)
GFR calc Af Amer: >60 mL/min (ref >60)
GFR calc non Af Amer: >60 mL/min (ref >60)
Glucose, Bld: 80 mg/dL (ref 65–99)
Potassium: 3.4 mmol/L — ABNORMAL LOW (ref 3.5–5.1)
Sodium: 132 mmol/L — ABNORMAL LOW (ref 135–145)
Total Bilirubin: 0.8 mg/dL (ref 0.3–1.2)
Total Protein: 8.6 g/dL — ABNORMAL HIGH (ref 6.5–8.1)

## 2017-01-28 LAB — URINALYSIS, COMPLETE (UACMP) WITH MICROSCOPIC
Bacteria, UA: NONE SEEN
Bilirubin Urine: NEGATIVE
Glucose, UA: NEGATIVE mg/dL
Ketones, ur: 80 mg/dL — AB
Leukocytes, UA: NEGATIVE
Nitrite: NEGATIVE
Protein, ur: 30 mg/dL — AB
Specific Gravity, Urine: 1.027 (ref 1.005–1.030)
pH: 6 (ref 5.0–8.0)

## 2017-01-28 LAB — CBC
HCT: 43.2 % (ref 35.0–47.0)
Hemoglobin: 14.6 g/dL (ref 12.0–16.0)
MCH: 30.2 pg (ref 26.0–34.0)
MCHC: 33.8 g/dL (ref 32.0–36.0)
MCV: 89.4 fL (ref 80.0–100.0)
Platelets: 210 10*3/uL (ref 150–440)
RBC: 4.84 MIL/uL (ref 3.80–5.20)
RDW: 13.4 % (ref 11.5–14.5)
WBC: 11.5 10*3/uL — ABNORMAL HIGH (ref 3.6–11.0)

## 2017-01-28 LAB — LIPASE, BLOOD: Lipase: 25 U/L (ref 11–51)

## 2017-01-28 LAB — POCT PREGNANCY, URINE: PREG TEST UR: NEGATIVE

## 2017-01-28 NOTE — ED Notes (Signed)
No answer when called several times from lobby 

## 2017-01-28 NOTE — ED Triage Notes (Signed)
Pt in with co nausea and vomiting since friday, states unable to eat or drink due to nausea. Also co upper abd pain, went to urgent care but sent here for possible dehydration.

## 2017-01-30 ENCOUNTER — Telehealth: Payer: Self-pay | Admitting: Emergency Medicine

## 2017-01-30 NOTE — Telephone Encounter (Signed)
Called patient due to lwot to inquire about condition and follow up plans. Says she is not feeling better, still not eating.  She does not have pcp or insurance.  Explained options for that, but also told her that her lab work from here is abnormal,.  Patient says she can't return because she has no ride and no one to watch kids.  I told her that she can return any time--expecially if worse.

## 2017-05-21 ENCOUNTER — Encounter: Payer: Self-pay | Admitting: Obstetrics and Gynecology

## 2017-06-28 ENCOUNTER — Ambulatory Visit (INDEPENDENT_AMBULATORY_CARE_PROVIDER_SITE_OTHER): Payer: Self-pay | Admitting: Obstetrics and Gynecology

## 2017-06-28 ENCOUNTER — Encounter: Payer: Self-pay | Admitting: Obstetrics and Gynecology

## 2017-06-28 VITALS — BP 120/85 | HR 103 | Ht 65.0 in | Wt 157.2 lb

## 2017-06-28 DIAGNOSIS — Z01419 Encounter for gynecological examination (general) (routine) without abnormal findings: Secondary | ICD-10-CM

## 2017-06-28 DIAGNOSIS — F320 Major depressive disorder, single episode, mild: Secondary | ICD-10-CM | POA: Insufficient documentation

## 2017-06-28 DIAGNOSIS — Z975 Presence of (intrauterine) contraceptive device: Secondary | ICD-10-CM | POA: Insufficient documentation

## 2017-06-28 DIAGNOSIS — E663 Overweight: Secondary | ICD-10-CM | POA: Insufficient documentation

## 2017-06-28 MED ORDER — CITALOPRAM HYDROBROMIDE 20 MG PO TABS: 20.0000 mg | ORAL_TABLET | Freq: Every day | ORAL | 3 refills | Status: DC

## 2017-06-28 NOTE — Patient Instructions (Addendum)
Major Depressive Disorder, Adult Major depressive disorder (MDD) is a mental health condition. MDD often makes you feel sad, hopeless, or helpless. MDD can also cause symptoms in your body. MDD can affect your:  Work.  School.  Relationships.  Other normal activities.  MDD can range from mild to very bad. It may occur once (single episode MDD). It can also occur many times (recurrent MDD). The main symptoms of MDD often include:  Feeling sad, depressed, or irritable most of the time.  Loss of interest.  MDD symptoms also include:  Sleeping too much or too little.  Eating too much or too little.  A change in your weight.  Feeling tired (fatigue) or having low energy.  Feeling worthless.  Feeling guilty.  Trouble making decisions.  Trouble thinking clearly.  Thoughts of suicide or harming others.  Feeling weak.  Feeling agitated.  Keeping yourself from being around other people (isolation).  Follow these instructions at home: Activity  Do these things as told by your doctor: ? Go back to your normal activities. ? Exercise regularly. ? Spend time outdoors. Alcohol  Talk with your doctor about how alcohol can affect your antidepressant medicines.  Do not drink alcohol. Or, limit how much alcohol you drink. ? This means no more than 1 drink a day for nonpregnant women and 2 drinks a day for men. One drink equals one of these:  12 oz of beer.  5 oz of wine.  1 oz of hard liquor. General instructions  Take over-the-counter and prescription medicines only as told by your doctor.  Eat a healthy diet.  Get plenty of sleep.  Find activities that you enjoy. Make time to do them.  Think about joining a support group. Your doctor may be able to suggest a group for you.  Keep all follow-up visits as told by your doctor. This is important. Where to find more information:  [employer] on Mental Illness: ? www.nami.org  [place] of  Mental Health: ? http://www.maynard.net/  National Suicide Prevention Lifeline: ? (402) 463-2275. This is free, 24-hour help. Contact a doctor if:  Your symptoms get worse.  You have new symptoms. Get help right away if:  You self-harm.  You see, hear, taste, smell, or feel things that are not present (hallucinate). If you ever feel like you may hurt yourself or others, or have thoughts about taking your own life, get help right away. You can go to your nearest emergency department or call:  Your local emergency services (911 in the [place]  A suicide crisis helpline, such as the National Suicide Prevention Lifeline: ? 7063725062. This is open 24 hours a day.  This information is not intended to replace advice given to you by your health care provider. Make sure you discuss any questions you have with your health care provider. Document Released: 08/22/2015 Document Revised: 05/27/2016 Document Reviewed: 05/27/2016 Elsevier Interactive Patient Education  2017 Else[employer] Inc. Citalopram tablets What is this medicine? CITALOPRAM (sye TAL oh pram) is a medicine for depression. This medicine may be used for other purposes; ask your health care provider or pharmacist if you have questions. COMMON BRAND NAME(S): Celexa What should I tell my health care provider before I take this medicine? They need to know if you have any of these conditions: -bleeding disorders -bipolar disorder or a family history of bipolar disorder -glaucoma -heart disease -history of irregular heartbeat -kidney disease -liver disease -low levels of magnesium or potassium in the blood -receiving electroconvulsive therapy -  seizures -suicidal thoughts, plans, or attempt; a previous suicide attempt by you or a family member -take medicines that treat or prevent blood clots -thyroid disease -an unusual or allergic reaction to citalopram, escitalopram, other medicines, foods, dyes, or preservatives -pregnant or  trying to become pregnant -breast-feeding How should I use this medicine? Take this medicine by mouth with a glass of water. Follow the directions on the prescription label. You can take it with or without food. Take your medicine at regular intervals. Do not take your medicine more often than directed. Do not stop taking this medicine suddenly except upon the advice of your doctor. Stopping this medicine too quickly may cause serious side effects or your condition may worsen. A special MedGuide will be given to you by the pharmacist with each prescription and refill. Be sure to read this information carefully each time. Talk to your pediatrician regarding the use of this medicine in children. Special care may be needed. Patients over 12 years old may have a stronger reaction and need a smaller dose. Overdosage: If you think you have taken too much of this medicine contact a poison control center or emergency room at once. NOTE: This medicine is only for you. Do not share this medicine with others. What if I miss a dose? If you miss a dose, take it as soon as you can. If it is almost time for your next dose, take only that dose. Do not take double or extra doses. What may interact with this medicine? Do not take this medicine with any of the following medications: -certain medicines for fungal infections like fluconazole, itraconazole, ketoconazole, posaconazole, voriconazole -cisapride -dofetilide -dronedarone -escitalopram -linezolid -MAOIs like Carbex, Eldepryl, Marplan, Nardil, and Parnate -methylene blue (injected into a vein) -pimozide -thioridazine -ziprasidone This medicine may also interact with the following medications: -alcohol -amphetamines -aspirin and aspirin-like medicines -carbamazepine -certain medicines for depression, anxiety, or psychotic disturbances -certain medicines for infections like chloroquine, clarithromycin, erythromycin, furazolidone, isoniazid,  pentamidine -certain medicines for migraine headaches like almotriptan, eletriptan, frovatriptan, naratriptan, rizatriptan, sumatriptan, zolmitriptan -certain medicines for sleep -certain medicines that treat or prevent blood clots like dalteparin, enoxaparin, warfarin -cimetidine -diuretics -fentanyl -lithium -methadone -metoprolol -NSAIDs, medicines for pain and inflammation, like ibuprofen or naproxen -omeprazole -other medicines that prolong the QT interval (cause an abnormal heart rhythm) -procarbazine -rasagiline -supplements like St. John's wort, kava kava, valerian -tramadol -tryptophan This list may not describe all possible interactions. Give your health care provider a list of all the medicines, herbs, non-prescription drugs, or dietary supplements you use. Also tell them if you smoke, drink alcohol, or use illegal drugs. Some items may interact with your medicine. What should I watch for while using this medicine? Tell your doctor if your symptoms do not get better or if they get worse. Visit your doctor or health care professional for regular checks on your progress. Because it may take several weeks to see the full effects of this medicine, it is important to continue your treatment as prescribed by your doctor. Patients and their families should watch out for new or worsening thoughts of suicide or depression. Also watch out for sudden changes in feelings such as feeling anxious, agitated, panicky, irritable, hostile, aggressive, impulsive, severely restless, overly excited and hyperactive, or not being able to sleep. If this happens, especially at the beginning of treatment or after a change in dose, call your health care professional. Bonita Quin may get drowsy or dizzy. Do not drive, use machinery, or do anything that  needs mental alertness until you know how this medicine affects you. Do not stand or sit up quickly, especially if you are an older patient. This reduces the risk of  dizzy or fainting spells. Alcohol may interfere with the effect of this medicine. Avoid alcoholic drinks. Your mouth may get dry. Chewing sugarless gum or sucking hard candy, and drinking plenty of water will help. Contact your doctor if the problem does not go away or is severe. What side effects may I notice from receiving this medicine? Side effects that you should report to your doctor or health care professional as soon as possible: -allergic reactions like skin rash, itching or hives, swelling of the face, lips, or tongue -anxious -black, tarry stools -breathing problems -changes in vision -chest pain -confusion -elevated mood, decreased need for sleep, racing thoughts, impulsive behavior -eye pain -fast, irregular heartbeat -feeling faint or lightheaded, falls -feeling agitated, angry, or irritable -hallucination, loss of contact with reality -loss of balance or coordination -loss of memory -painful or prolonged erections -restlessness, pacing, inability to keep still -seizures -stiff muscles -suicidal thoughts or other mood changes -trouble sleeping -unusual bleeding or bruising -unusually weak or tired -vomiting Side effects that usually do not require medical attention (report to your doctor or health care professional if they continue or are bothersome): -change in appetite or weight -change in sex drive or performance -dizziness -headache -increased sweating -indigestion, nausea -tremors This list may not describe all possible side effects. Call your doctor for medical advice about side effects. You may report side effects to FDA at 1-800-FDA-1088. Where should I keep my medicine? Keep out of reach of children. Store at room temperature between 15 and 30 degrees C (59 and 86 degrees F). Throw away any unused medicine after the expiration date. NOTE: This sheet is a summary. It may not cover all possible information. If you have questions about this medicine, talk  to your doctor, pharmacist, or health care provider.  2018 Elsevier/Gold Standard (2016-02-13 13:18:52)

## 2017-06-28 NOTE — Progress Notes (Signed)
GYNECOLOGY ANNUAL PHYSICAL EXAM PROGRESS NOTE  Subjective:    Barbara Mendez is a 26 y.o. [phone] female who presents for an annual exam. The patient has no complaints today. The patient is sexually active. The patient wears seatbelts: yes. The patient participates in regular exercise: no. Has the patient ever been transfused or tattooed?: not asked. The patient reports that there is not domestic violence in her life.    1.  Moments of sadness (usually lasting a day or 2, intermittent, but occurs at least ever 1-2 weeks). Over the past year. Also noting insomnia. Denies appetite changes, anhedonia.  Has not tried anything for sleeping.  Notes that since she has not been working over the past year, she has been noticing these symptoms slowly getting worse.    Gynecologic History Menarche age: 35 Patient's last menstrual period was 04/09/2016 (exact date). Contraception: Mirena IUD (inserted 08/09/2015). History of STI's: Denies Last Pap:  12/16/2014. Results were: normal.  Denies h/o abnormal pap smears.    Obstetric History   G3   P2   T2   P0   A1   L2    SAB1   TAB0   Ectopic0   Multiple0   Live Births2     # Outcome Date GA Lbr Len/2nd Weight Sex Delivery Anes PTL Lv  3 Term 06/13/15 [redacted]w[redacted]d  5 lb 15.6 oz (2.71 kg) F CS-LTranv Spinal  LIV     Name: CAMPBELL,GIRL Krishika     Apgar1:  8                Apgar5: 9  2 SAB 08/24/14     SAB   FD  1 Term 2010   8 lb (3.629 kg) F CS-LTranv  N LIV      Past Medical History:  Diagnosis Date  . Hypotension     Past Surgical History:  Procedure Laterality Date  . CESAREAN SECTION    . CESAREAN SECTION N/A 06/13/2015   Procedure: CESAREAN SECTION;  Surgeon: Hildred Laser, MD;  Location: [facility] ORS;  Service: Obstetrics;  Laterality: N/A;    Family History  Problem Relation Age of Onset  . Hypertension Father   . Breast cancer Neg Hx   . Ovarian cancer Neg Hx   . Colon cancer Neg Hx   . Diabetes Neg Hx     Social History    Social History  . Marital status: Single    Spouse name: N/A  . Number of children: N/A  . Years of education: N/A   Occupational History  . Not on file.   Social History Main Topics  . Smoking status: Current Every Day Smoker    Packs/day: 0.25    Types: Cigars    Last attempt to quit: 09/24/2014  . Smokeless tobacco: Never Used  . Alcohol use No  . Drug use: No  . Sexual activity: Yes    Birth control/ protection: None, IUD     Comment: mirena    Other Topics Concern  . Not on file   Social History Narrative  . No narrative on file    Current Outpatient Prescriptions on File Prior to Visit  Medication Sig Dispense Refill  . levonorgestrel (MIRENA) 20 MCG/24HR IUD 1 Intra Uterine Device (1 each total) by Intrauterine route once. 1 each 0   No current facility-administered medications on file prior to visit.     No Known Allergies  Review of Systems Constitutional: negative for chills, fatigue, fevers  and sweats Eyes: negative for irritation, redness and visual disturbance Ears, nose, mouth, throat, and face: negative for hearing loss, nasal congestion, snoring and tinnitus Respiratory: negative for asthma, cough, sputum Cardiovascular: negative for chest pain, dyspnea, exertional chest pressure/discomfort, irregular heart beat, palpitations and syncope Gastrointestinal: negative for abdominal pain, change in bowel habits, nausea and vomiting Genitourinary: Negative for abnormal menstrual periods, genital lesions, sexual problems and vaginal discharge, pelvic pain, dysuria and urinary incontinence Integument/breast: negative for breast lump, breast tenderness and nipple discharge Hematologic/lymphatic: negative for bleeding and easy bruising Musculoskeletal:negative for back pain and muscle weakness Neurological: negative for dizziness, headaches, vertigo and weakness Endocrine: negative for diabetic symptoms including polydipsia, polyuria and skin  dryness Allergic/Immunologic: negative for hay fever and urticaria   Psychological: negative for anxiety, concentration difficulties, mood swings or suicidal ideation. Positive for depression and sleep disturbances     Objective:  Blood pressure 120/85, pulse (!) 103, height  (1.651 m), weight 157 lb 3.2 oz (71.3 kg), last menstrual period 06/27/2017. Body mass index is 26.16 kg/m.  General Appearance:    Alert, cooperative, no distress, appears stated age, overweight  Head:    Normocephalic, without obvious abnormality, atraumatic  Eyes:    PERRL, conjunctiva/corneas clear, EOM's intact, both eyes  Ears:    Normal external ear canals, both ears  Nose:   Nares normal, septum midline, mucosa normal, no drainage or sinus tenderness  Throat:   Lips, mucosa, and tongue normal; teeth and gums normal  Neck:   Supple, symmetrical, trachea midline, no adenopathy; thyroid: no enlargement/tenderness/nodules; no carotid bruit or JVD  Back:     Symmetric, no curvature, ROM normal, no CVA tenderness  Lungs:     Clear to auscultation bilaterally, respirations unlabored  Chest Wall:    No tenderness or deformity   Heart:    Regular rate and rhythm, S1 and S2 normal, no murmur, rub or gallop  Breast Exam:    No tenderness, masses, or nipple abnormality  Abdomen:     Soft, non-tender, bowel sounds active all four quadrants, no masses, no organomegaly.    Genitalia:    Pelvic:external genitalia normal, vagina without lesions, discharge, or tenderness.  Dark red blood in vaginal vault.  Rectovaginal septum  normal. Cervix normal in appearance, no cervical motion tenderness, no adnexal masses or tenderness.  IUD strings visible, ~ 3 cm in length. Uterus normal size, shape, mobile, regular contours, nontender.  Rectal:    Normal external sphincter.  No hemorrhoids appreciated. Internal exam not done.   Extremities:   Extremities normal, atraumatic, no cyanosis or edema  Pulses:   2+ and symmetric all  extremities  Skin:   Skin color, texture, turgor normal, no rashes or lesions  Lymph nodes:   Cervical, supraclavicular, and axillary nodes normal  Neurologic:   CNII-XII intact, normal strength, sensation and reflexes throughout  Psychologic:   Normal thought process, speech.     .  Labs:  Lab Results  Component Value Date   WBC 11.5 (H) 01/28/2017   HGB 14.6 01/28/2017   HCT 43.2 01/28/2017   MCV 89.4 01/28/2017   PLT 210 01/28/2017    Lab Results  Component Value Date   CREATININE 0.85 01/28/2017   BUN 13 01/28/2017   NA 132 (L) 01/28/2017   K 3.4 (L) 01/28/2017   CL 100 (L) 01/28/2017   CO2 22 01/28/2017    Lab Results  Component Value Date   ALT 11 (L) 01/28/2017   AST 20  01/28/2017   ALKPHOS 59 01/28/2017   BILITOT 0.8 01/28/2017    No results found for: TSH   Assessment:    Healthy female exam.   Overweight  Mild depression  Plan:    - Blood tests: Labs reviewed. - Breast self exam technique reviewed and patient encouraged to perform self-exam monthly. - Contraception: Mirena IUD.    - Discussed healthy lifestyle modifications. - Pap smear up to date. Due in 2019.   - Declined flu vaccine - Depression screen positive for mild depression (PHQ-9 score was 11) Discussed treatment options for depression, including counseling and/or medications. Patient considered counseling but notes that she is self-pay, and would not be able to afford sessions.  Also notes that she would not have guaranteed transportation to make it to sessions. Will initiate Celexa (on $4 list at several pharmacies).  Patient to notify by Mychart (due to transportation issues and self-pay status)after 4 weeks to assess if symptoms improving.  - Follow up in 1 year.     Hildred Laser, MD [facility]

## 2017-07-29 ENCOUNTER — Encounter: Payer: Self-pay | Admitting: Obstetrics and Gynecology

## 2018-02-03 ENCOUNTER — Encounter: Payer: Self-pay | Admitting: Obstetrics and Gynecology

## 2018-06-02 ENCOUNTER — Emergency Department
Admission: EM | Admit: 2018-06-02 | Discharge: 2018-06-02 | Disposition: A | Discharge status: Home / Self Care | Payer: Self-pay | Attending: Student in an Organized Health Care Education/Training Program | Admitting: Student in an Organized Health Care Education/Training Program

## 2018-06-02 ENCOUNTER — Emergency Department: Payer: Self-pay

## 2018-06-02 ENCOUNTER — Other Ambulatory Visit: Payer: Self-pay

## 2018-06-02 ENCOUNTER — Encounter: Payer: Self-pay | Admitting: *Deleted

## 2018-06-02 DIAGNOSIS — B9689 Other specified bacterial agents as the cause of diseases classified elsewhere: Secondary | ICD-10-CM | POA: Insufficient documentation

## 2018-06-02 DIAGNOSIS — N760 Acute vaginitis: Secondary | ICD-10-CM | POA: Insufficient documentation

## 2018-06-02 DIAGNOSIS — Z79899 Other long term (current) drug therapy: Secondary | ICD-10-CM | POA: Insufficient documentation

## 2018-06-02 DIAGNOSIS — N200 Calculus of kidney: Secondary | ICD-10-CM

## 2018-06-02 DIAGNOSIS — F17290 Nicotine dependence, other tobacco product, uncomplicated: Secondary | ICD-10-CM | POA: Insufficient documentation

## 2018-06-02 DIAGNOSIS — R102 Pelvic and perineal pain: Secondary | ICD-10-CM

## 2018-06-02 DIAGNOSIS — R110 Nausea: Secondary | ICD-10-CM

## 2018-06-02 DIAGNOSIS — N201 Calculus of ureter: Secondary | ICD-10-CM | POA: Insufficient documentation

## 2018-06-02 HISTORY — DX: Calculus of kidney: N20.0

## 2018-06-02 LAB — URINALYSIS, COMPLETE (UACMP) WITH MICROSCOPIC
BACTERIA UA: NONE SEEN
Bilirubin Urine: NEGATIVE
Glucose, UA: NEGATIVE mg/dL
Ketones, ur: 5 mg/dL — AB
Leukocytes, UA: NEGATIVE
NITRITE: NEGATIVE
Protein, ur: 30 mg/dL — AB
RBC / HPF: >50 RBC/hpf — ABNORMAL HIGH (ref 0–5)
SPECIFIC GRAVITY, URINE: 1.015 (ref 1.005–1.030)
WBC, UA: >50 WBC/hpf — ABNORMAL HIGH (ref 0–5)
pH: 8 (ref 5.0–8.0)

## 2018-06-02 LAB — WET PREP, GENITAL
SPERM: NONE SEEN
Trich, Wet Prep: NONE SEEN
YEAST WET PREP: NONE SEEN

## 2018-06-02 LAB — COMPREHENSIVE METABOLIC PANEL
ALBUMIN: 4.2 g/dL (ref 3.5–5.0)
ALT: 12 U/L (ref 0–44)
AST: 13 U/L — AB (ref 15–41)
Alkaline Phosphatase: 50 U/L (ref 38–126)
Anion gap: 6 (ref 5–15)
BILIRUBIN TOTAL: 0.6 mg/dL (ref 0.3–1.2)
BUN: 12 mg/dL (ref 6–20)
CO2: 26 mmol/L (ref 22–32)
Calcium: 8.9 mg/dL (ref 8.9–10.3)
Chloride: 108 mmol/L (ref 98–111)
Creatinine, Ser: 0.65 mg/dL (ref 0.44–1.00)
GFR calc Af Amer: >60 mL/min (ref >60)
GFR calc non Af Amer: >60 mL/min (ref >60)
GLUCOSE: 95 mg/dL (ref 70–99)
POTASSIUM: 3.8 mmol/L (ref 3.5–5.1)
Sodium: 140 mmol/L (ref 135–145)
TOTAL PROTEIN: 7.9 g/dL (ref 6.5–8.1)

## 2018-06-02 LAB — CBC
HEMATOCRIT: 39 % (ref 35.0–47.0)
Hemoglobin: 13.3 g/dL (ref 12.0–16.0)
MCH: 30.9 pg (ref 26.0–34.0)
MCHC: 34.1 g/dL (ref 32.0–36.0)
MCV: 90.4 fL (ref 80.0–100.0)
Platelets: 342 10*3/uL (ref 150–440)
RBC: 4.31 MIL/uL (ref 3.80–5.20)
RDW: 13.7 % (ref 11.5–14.5)
WBC: 10.6 10*3/uL (ref 3.6–11.0)

## 2018-06-02 LAB — POCT PREGNANCY, URINE: PREG TEST UR: NEGATIVE

## 2018-06-02 LAB — CHLAMYDIA/NGC RT PCR (ARMC ONLY)
CHLAMYDIA TR: NOT DETECTED
N GONORRHOEAE: NOT DETECTED

## 2018-06-02 LAB — LIPASE, BLOOD: Lipase: 25 U/L (ref 11–51)

## 2018-06-02 MED ORDER — SODIUM CHLORIDE 0.9 % IV BOLUS
500.0000 mL | Freq: Once | INTRAVENOUS | Status: AC
  Administered 2018-06-02: 500 mL via INTRAVENOUS

## 2018-06-02 MED ORDER — MORPHINE SULFATE (PF) 4 MG/ML IV SOLN
4.0000 mg | INTRAVENOUS | Status: DC | PRN
  Administered 2018-06-02: 4 mg via INTRAVENOUS
  Filled 2018-06-02 (×2): qty 1

## 2018-06-02 MED ORDER — ONDANSETRON HCL 4 MG/2ML IJ SOLN
4.0000 mg | Freq: Once | INTRAMUSCULAR | Status: DC
  Filled 2018-06-02: qty 2

## 2018-06-02 MED ORDER — ONDANSETRON HCL 4 MG/2ML IJ SOLN
4.0000 mg | Freq: Once | INTRAMUSCULAR | Status: AC
  Administered 2018-06-02: 4 mg via INTRAVENOUS

## 2018-06-02 MED ORDER — HYDROCODONE-ACETAMINOPHEN 5-325 MG PO TABS: 1.0000 | ORAL_TABLET | ORAL | 0 refills | Status: DC | PRN

## 2018-06-02 MED ORDER — KETOROLAC TROMETHAMINE 30 MG/ML IJ SOLN
15.0000 mg | Freq: Once | INTRAMUSCULAR | Status: AC
  Administered 2018-06-02: 15 mg via INTRAVENOUS
  Filled 2018-06-02: qty 1

## 2018-06-02 MED ORDER — PROCHLORPERAZINE MALEATE 10 MG PO TABS: 10.0000 mg | ORAL_TABLET | Freq: Four times a day (QID) | ORAL | 0 refills | Status: DC | PRN

## 2018-06-02 MED ORDER — TAMSULOSIN HCL 0.4 MG PO CAPS: 0.4000 mg | ORAL_CAPSULE | Freq: Every day | ORAL | 0 refills | Status: DC

## 2018-06-02 MED ORDER — PROMETHAZINE HCL 25 MG/ML IJ SOLN
12.5000 mg | Freq: Four times a day (QID) | INTRAMUSCULAR | Status: DC | PRN
  Administered 2018-06-02: 12.5 mg via INTRAVENOUS
  Filled 2018-06-02: qty 1

## 2018-06-02 MED ORDER — METRONIDAZOLE 500 MG PO TABS: 500.0000 mg | ORAL_TABLET | Freq: Two times a day (BID) | ORAL | 0 refills | Status: AC

## 2018-06-02 MED ORDER — IOPAMIDOL (ISOVUE-300) INJECTION 61%
100.0000 mL | Freq: Once | INTRAVENOUS | Status: AC | PRN
  Administered 2018-06-02: 100 mL via INTRAVENOUS
  Filled 2018-06-02: qty 100

## 2018-06-02 MED ORDER — SODIUM CHLORIDE 0.9 % IV BOLUS
1000.0000 mL | Freq: Once | INTRAVENOUS | Status: AC
  Administered 2018-06-02: 1000 mL via INTRAVENOUS

## 2018-06-02 NOTE — ED Notes (Signed)
Patient transported to Ultrasound 

## 2018-06-02 NOTE — Discharge Instructions (Addendum)

## 2018-06-02 NOTE — ED Triage Notes (Signed)
Pt reports waking up this morning with low abd pain.  Pt had episode n/v.  Pt reports no vag discharge.  No vag bleeding.  No urinary sx.  Pt crying in triage.

## 2018-06-02 NOTE — ED Provider Notes (Signed)
[facility] [place] Emergency Department Provider Note    First MD Initiated Contact with Patient 06/02/18 1804     (approximate)  I have reviewed the triage vital signs and the nursing notes.   HISTORY  Chief Complaint Abdominal Pain    HPI Barbara Mendez is a 27 y.o. female presents the ER with chief complaint of lower abdominal pain associated with nausea vomiting x1.  Says she is having pelvic pain.  Status post IUD placement 3 years ago.  Denies any discharge.  Did have some spotting last night.  States that she will intermittently have vaginal bleeding after intercourse.  Did not have any intercourse last night.  Never had pain like this before.  Does have a history of ovarian cyst.  Denies any shortness of breath.  No fevers.  No pain radiating to her back.  Patient is crying and states the pain is moderate to severe.    Past Medical History:  Diagnosis Date  . Hypotension    Family History  Problem Relation Age of Onset  . Hypertension Father   . Breast cancer Neg Hx   . Ovarian cancer Neg Hx   . Colon cancer Neg Hx   . Diabetes Neg Hx    Past Surgical History:  Procedure Laterality Date  . CESAREAN SECTION    . CESAREAN SECTION N/A 06/13/2015   Procedure: CESAREAN SECTION;  Surgeon: Hildred Laser, MD;  Location: [facility] ORS;  Service: Obstetrics;  Laterality: N/A;   Patient Active Problem List   Diagnosis Date Noted  . Overweight (BMI 25.0-29.9) 06/28/2017  . Current mild episode of major depressive disorder without prior episode (HCC) 06/28/2017  . IUD (intrauterine device) in place 06/28/2017  . History of tobacco abuse 03/10/2015      Prior to Admission medications   Medication Sig Start Date End Date Taking? Authorizing Provider  citalopram (CELEXA) 20 MG tablet Take 1 tablet (20 mg total) by mouth daily. 06/28/17   Hildred Laser, MD  HYDROcodone-acetaminophen (NORCO) 5-325 MG tablet Take 1 tablet by mouth every 4 (four) hours as needed  for moderate pain. 06/02/18   Willy Eddy, MD  levonorgestrel (MIRENA) 20 MCG/24HR IUD 1 Intra Uterine Device (1 each total) by Intrauterine route once. 08/09/15   Hildred Laser, MD  metroNIDAZOLE (FLAGYL) 500 MG tablet Take 1 tablet (500 mg total) by mouth 2 (two) times daily for 7 days. 06/02/18 06/09/18  Willy Eddy, MD  prochlorperazine (COMPAZINE) 10 MG tablet Take 1 tablet (10 mg total) by mouth every 6 (six) hours as needed for nausea or vomiting. 06/02/18   Willy Eddy, MD  tamsulosin (FLOMAX) 0.4 MG CAPS capsule Take 1 capsule (0.4 mg total) by mouth daily after supper. 06/02/18   Willy Eddy, MD    Allergies Patient has no known allergies.    Social History Social History   Tobacco Use  . Smoking status: Current Every Day Smoker    Packs/day: 0.25    Types: Cigars    Last attempt to quit: 09/24/2014    Years since quitting: 3.6  . Smokeless tobacco: Never Used  Substance Use Topics  . Alcohol use: No  . Drug use: No    Review of Systems Patient denies headaches, rhinorrhea, blurry vision, numbness, shortness of breath, chest pain, edema, cough, abdominal pain, nausea, vomiting, diarrhea, dysuria, fevers, rashes or hallucinations unless otherwise stated above in HPI. ____________________________________________   PHYSICAL EXAM:  VITAL SIGNS: Vitals:   06/02/18 1725 06/02/18 1837  BP: Marland Kitchen)  167/115   Pulse: 98   Resp: (!) 22   Temp: 99 F (37.2 C) (!) 97.4 F (36.3 C)  SpO2: 99%     Constitutional: Alert and oriented.  Eyes: Conjunctivae are normal.  Head: Atraumatic. Nose: No congestion/rhinnorhea. Mouth/Throat: Mucous membranes are moist.   Neck: No stridor. Painless ROM.  Cardiovascular: Normal rate, regular rhythm. Grossly normal heart sounds.  Good peripheral circulation. Respiratory: Normal respiratory effort.  No retractions. Lungs CTAB. Gastrointestinal: Soft and nontender. No distention. No abdominal bruits. No CVA  tenderness. Genitourinary: Normal-appearing external genitalia.  No cervical motion tenderness.  No adnexal tenderness.  IUD wires are visible no bleeding.  Normal-appearing cervix.  No discharge coming from cervix. Musculoskeletal: No lower extremity tenderness nor edema.  No joint effusions. Neurologic:  Normal speech and language. No gross focal neurologic deficits are appreciated. No facial droop Skin:  Skin is warm, dry and intact. No rash noted. Psychiatric:  Speech and behavior are normal.  ____________________________________________   LABS (all labs ordered are listed, but only abnormal results are displayed)  Results for orders placed or performed during the hospital encounter of 06/02/18 (from the past 24 hour(s))  Lipase, blood     Status: None   Collection Time: 06/02/18  5:27 PM  Result Value Ref Range   Lipase 25 11 - 51 U/L  Comprehensive metabolic panel     Status: Abnormal   Collection Time: 06/02/18  5:27 PM  Result Value Ref Range   Sodium 140 135 - 145 mmol/L   Potassium 3.8 3.5 - 5.1 mmol/L   Chloride 108 98 - 111 mmol/L   CO2 26 22 - 32 mmol/L   Glucose, Bld 95 70 - 99 mg/dL   BUN 12 6 - 20 mg/dL   Creatinine, Ser 1.61 0.44 - 1.00 mg/dL   Calcium 8.9 8.9 - 09.6 mg/dL   Total Protein 7.9 6.5 - 8.1 g/dL   Albumin 4.2 3.5 - 5.0 g/dL   AST 13 (L) 15 - 41 U/L   ALT 12 0 - 44 U/L   Alkaline Phosphatase 50 38 - 126 U/L   Total Bilirubin 0.6 0.3 - 1.2 mg/dL   GFR calc non Af Amer >60 >60 mL/min   GFR calc Af Amer >60 >60 mL/min   Anion gap 6 5 - 15  CBC     Status: None   Collection Time: 06/02/18  5:27 PM  Result Value Ref Range   WBC 10.6 3.6 - 11.0 K/uL   RBC 4.31 3.80 - 5.20 MIL/uL   Hemoglobin 13.3 12.0 - 16.0 g/dL   HCT 04.5 40.9 - 81.1 %   MCV 90.4 80.0 - 100.0 fL   MCH 30.9 26.0 - 34.0 pg   MCHC 34.1 32.0 - 36.0 g/dL   RDW 91.4 78.2 - 95.6 %   Platelets 342 150 - 440 K/uL  Urinalysis, Complete w Microscopic     Status: Abnormal   Collection  Time: 06/02/18  5:27 PM  Result Value Ref Range   Color, Urine YELLOW (A) YELLOW   APPearance CLOUDY (A) CLEAR   Specific Gravity, Urine 1.015 1.005 - 1.030   pH 8.0 5.0 - 8.0   Glucose, UA NEGATIVE NEGATIVE mg/dL   Hgb urine dipstick LARGE (A) NEGATIVE   Bilirubin Urine NEGATIVE NEGATIVE   Ketones, ur 5 (A) NEGATIVE mg/dL   Protein, ur 30 (A) NEGATIVE mg/dL   Nitrite NEGATIVE NEGATIVE   Leukocytes, UA NEGATIVE NEGATIVE   RBC / HPF >50 (H)  0 - 5 RBC/hpf   WBC, UA >50 (H) 0 - 5 WBC/hpf   Bacteria, UA NONE SEEN NONE SEEN   Squamous Epithelial / LPF 6-10 0 - 5   Mucus PRESENT   Pregnancy, urine POC     Status: None   Collection Time: 06/02/18  5:29 PM  Result Value Ref Range   Preg Test, Ur NEGATIVE NEGATIVE  Chlamydia/NGC rt PCR ([facility] only)     Status: None   Collection Time: 06/02/18  8:09 PM  Result Value Ref Range   Specimen source GC/Chlam ENDOCERVICAL    Chlamydia Tr NOT DETECTED NOT DETECTED   N gonorrhoeae NOT DETECTED NOT DETECTED  Wet prep, genital     Status: Abnormal   Collection Time: 06/02/18  8:09 PM  Result Value Ref Range   Yeast Wet Prep HPF POC NONE SEEN NONE SEEN   Trich, Wet Prep NONE SEEN NONE SEEN   Clue Cells Wet Prep HPF POC PRESENT (A) NONE SEEN   WBC, Wet Prep HPF POC MODERATE (A) NONE SEEN   Sperm NONE SEEN    ____________________________________________  ____________________________________________  RADIOLOGY  I personally reviewed all radiographic images ordered to evaluate for the above acute complaints and reviewed radiology reports and findings.  These findings were personally discussed with the patient.  Please see medical record for radiology report.  ____________________________________________   PROCEDURES  Procedure(s) performed:  Procedures    Critical Care performed: no ____________________________________________   INITIAL IMPRESSION / ASSESSMENT AND PLAN / ED COURSE  Pertinent labs & imaging results that were  available during my care of the patient were reviewed by me and considered in my medical decision making (see chart for details).   DDX: torsion, ectopic, appy, uti, stone, cyst, pid, toa  Barbara Mendez is a 27 y.o. who presents to the ED with abdominal pain as described above.  She is afebrile hemodynamically stable but tensive and obviously uncomfortable.  No white count.  But will be sent for the above differential.  Will provide IV fluids.  Based on her onset of pain will evaluate for evidence of torsion.  Possible appendicitis but she does not have any peritonitis and is afebrile at this time with no white count.  Will initiate and start work-up with ultrasound  Clinical Course as of Jun 02 2208  [facility] Jun 02, 2018  2010 Pelvic exam with no significant cervical motion tenderness.  No adnexal tenderness.  IUD with strings visible but no IUD present.  Husband at bedside states she was complaining of severe right lower quadrant pain earlier.  Based on her mild white count will order CT imaging to exclude appendicitis or perforated process.   [PR]  2059 Patient with evidence of right sided ureterolithiasis.  No evidence of infection.  No evidence of appendicitis.  Pain is improving.  Will give dose of Toradol and reassess.   [PR]  2203 Patient was able to tolerate PO and was able to ambulate with a steady gait.  Have discussed with the patient and available family all diagnostics and treatments performed thus far and all questions were answered to the best of my ability. The patient demonstrates understanding and agreement with plan.     [PR]    Clinical Course User Index [PR] Willy Eddy, MD     As part of my medical decision making, I reviewed the following data within the electronic MEDICAL RECORD NUMBER Nursing notes reviewed and incorporated, Labs reviewed, notes from prior ED visits.  ____________________________________________   FINAL CLINICAL IMPRESSION(S) / ED  DIAGNOSES  Final diagnoses:  Pelvic pain  Nausea  Ureterolithiasis  Bacterial vaginosis      NEW MEDICATIONS STARTED DURING THIS VISIT:  New Prescriptions   HYDROCODONE-ACETAMINOPHEN (NORCO) 5-325 MG TABLET    Take 1 tablet by mouth every 4 (four) hours as needed for moderate pain.   METRONIDAZOLE (FLAGYL) 500 MG TABLET    Take 1 tablet (500 mg total) by mouth 2 (two) times daily for 7 days.   PROCHLORPERAZINE (COMPAZINE) 10 MG TABLET    Take 1 tablet (10 mg total) by mouth every 6 (six) hours as needed for nausea or vomiting.   TAMSULOSIN (FLOMAX) 0.4 MG CAPS CAPSULE    Take 1 capsule (0.4 mg total) by mouth daily after supper.     Note:  This document was prepared using Dragon voice recognition software and may include unintentional dictation errors.    Willy Eddy, MD 06/02/18 2209

## 2018-06-04 ENCOUNTER — Telehealth: Payer: Self-pay | Admitting: Obstetrics and Gynecology

## 2018-06-04 ENCOUNTER — Ambulatory Visit (INDEPENDENT_AMBULATORY_CARE_PROVIDER_SITE_OTHER): Payer: Self-pay | Admitting: Obstetrics and Gynecology

## 2018-06-04 ENCOUNTER — Encounter: Payer: Self-pay | Admitting: Obstetrics and Gynecology

## 2018-06-04 VITALS — BP 126/74 | HR 106 | Ht 65.0 in | Wt 157.4 lb

## 2018-06-04 DIAGNOSIS — Z975 Presence of (intrauterine) contraceptive device: Secondary | ICD-10-CM

## 2018-06-04 DIAGNOSIS — Z30432 Encounter for removal of intrauterine contraceptive device: Secondary | ICD-10-CM

## 2018-06-04 DIAGNOSIS — N921 Excessive and frequent menstruation with irregular cycle: Secondary | ICD-10-CM

## 2018-06-04 DIAGNOSIS — Z30011 Encounter for initial prescription of contraceptive pills: Secondary | ICD-10-CM

## 2018-06-04 NOTE — Patient Instructions (Signed)
Oral Contraception Information Oral contraceptive pills (OCPs) are medicines taken to prevent pregnancy. OCPs work by preventing the ovaries from releasing eggs. The hormones in OCPs also cause the cervical mucus to thicken, preventing the sperm from entering the uterus. The hormones also cause the uterine lining to become thin, not allowing a fertilized egg to attach to the inside of the uterus. OCPs are highly effective when taken exactly as prescribed. However, OCPs do not prevent sexually transmitted diseases (STDs). Safe sex practices, such as using condoms along with the pill, can help prevent STDs. Before taking the pill, you may have a physical exam and Pap test. Your health care provider may order blood tests. The health care provider will make sure you are a good candidate for oral contraception. Discuss with your health care provider the possible side effects of the OCP you may be prescribed. When starting an OCP, it can take 2 to 3 months for the body to adjust to the changes in hormone levels in your body. Types of oral contraception  The combination pill-This pill contains estrogen and progestin (synthetic progesterone) hormones. The combination pill comes in 21-day, 28-day, or 91-day packs. Some types of combination pills are meant to be taken continuously (365-day pills). With 21-day packs, you do not take pills for 7 days after the last pill. With 28-day packs, the pill is taken every day. The last 7 pills are without hormones. Certain types of pills have more than 21 hormone-containing pills. With 91-day packs, the first 84 pills contain both hormones, and the last 7 pills contain no hormones or contain estrogen only.  The minipill-This pill contains the progesterone hormone only. The pill is taken every day continuously. It is very important to take the pill at the same time each day. The minipill comes in packs of 28 pills. All 28 pills contain the hormone. Advantages of oral  contraceptive pills  Decreases premenstrual symptoms.  Treats menstrual period cramps.  Regulates the menstrual cycle.  Decreases a heavy menstrual flow.  May treatacne, depending on the type of pill.  Treats abnormal uterine bleeding.  Treats polycystic ovarian syndrome.  Treats endometriosis.  Can be used as emergency contraception. Things that can make oral contraceptive pills less effective OCPs can be less effective if:  You forget to take the pill at the same time every day.  You have a stomach or intestinal disease that lessens the absorption of the pill.  You take OCPs with other medicines that make OCPs less effective, such as antibiotics, certain HIV medicines, and some seizure medicines.  You take expired OCPs.  You forget to restart the pill on day 7, when using the packs of 21 pills.  Risks associated with oral contraceptive pills Oral contraceptive pills can sometimes cause side effects, such as:  Headache.  Nausea.  Breast tenderness.  Irregular bleeding or spotting.  Combination pills are also associated with a small increased risk of:  Blood clots.  Heart attack.  Stroke.  This information is not intended to replace advice given to you by your health care provider. Make sure you discuss any questions you have with your health care provider. Document Released: 12/01/2002 Document Revised: 02/16/2016 Document Reviewed: 03/01/2013 Elsevier Interactive Patient Education  2018 [employer]  

## 2018-06-04 NOTE — Telephone Encounter (Signed)
The patient was irritated when checking in due to me not being able to find her name on the schedule, The patient stated her former last name and then stated her new last name really low and hard to understand. I eventually found the pt on the schedule and apologized, the patient rolled her eyes and walked off.

## 2018-06-04 NOTE — Addendum Note (Signed)
Addended by: Fabian November on: 06/04/2018 02:18 PM   Modules accepted: Level of Service

## 2018-06-04 NOTE — Progress Notes (Signed)
    GYNECOLOGY OFFICE PROCEDURE NOTE  Barbara Mendez is a 27 y.o. [id number] here for Mirena IUD removal. She has had the IUD in place since 08/09/2015. Notes concerns of irregular bleeding (having 2-3 periods per month, or having 1 period per month that lasts 2 weeks).  Has been ongoing since earlier this year.  Notes she was seen in the ER several days ago for complaints of nausea and abdominal pain, and had an ultrasound noting the IUD was in the lower uterus and was out of place.  Last pap smear was on  and was normal.  IUD Removal  Patient identified, informed consent performed, consent signed.  Patient was in the dorsal lithotomy position, normal external genitalia was noted.  A speculum was placed in the patient's vagina, normal discharge was noted, no lesions. The cervix was visualized, no lesions, no abnormal discharge.  The strings of the IUD were grasped and pulled using ring forceps. The IUD was removed in its entirety.  Patient tolerated the procedure well.    Patient will use OCPs for contraception.  Given samples of Taytulla today. Advised to use back up method for 1 week.Routine preventative health maintenance measures emphasized.

## 2018-06-04 NOTE — Progress Notes (Signed)
PT is present today for removal of IUD(Mirena) pt stated that the IUD is causing her cycle to stay on longer. Pt stated she went to the hospital on Monday, Sept 9, 19 and has kidney stones.

## 2018-06-10 ENCOUNTER — Encounter: Payer: Self-pay | Admitting: Obstetrics and Gynecology

## 2018-07-01 ENCOUNTER — Ambulatory Visit: Payer: Self-pay | Admitting: Obstetrics and Gynecology

## 2018-07-01 ENCOUNTER — Encounter: Payer: Self-pay | Admitting: Obstetrics and Gynecology

## 2018-07-01 ENCOUNTER — Other Ambulatory Visit (HOSPITAL_COMMUNITY)
Admission: RE | Admit: 2018-07-01 | Discharge: 2018-07-01 | Disposition: A | Discharge status: Home / Self Care | Payer: Self-pay | Source: Ambulatory Visit | Attending: Obstetrics and Gynecology | Admitting: Obstetrics and Gynecology

## 2018-07-01 ENCOUNTER — Other Ambulatory Visit: Payer: Self-pay

## 2018-07-01 ENCOUNTER — Telehealth: Payer: Self-pay | Admitting: Obstetrics and Gynecology

## 2018-07-01 VITALS — BP 138/84 | HR 92 | Ht 65.0 in | Wt 153.2 lb

## 2018-07-01 DIAGNOSIS — Z30011 Encounter for initial prescription of contraceptive pills: Secondary | ICD-10-CM

## 2018-07-01 DIAGNOSIS — Z01419 Encounter for gynecological examination (general) (routine) without abnormal findings: Secondary | ICD-10-CM

## 2018-07-01 DIAGNOSIS — Z124 Encounter for screening for malignant neoplasm of cervix: Secondary | ICD-10-CM

## 2018-07-01 DIAGNOSIS — N926 Irregular menstruation, unspecified: Secondary | ICD-10-CM

## 2018-07-01 MED ORDER — NORETHIN ACE-ETH ESTRAD-FE 1.5-30 MG-MCG PO TABS: 1.0000 | ORAL_TABLET | Freq: Every day | ORAL | 4 refills | Status: DC

## 2018-07-01 MED ORDER — NORETHIN ACE-ETH ESTRAD-FE 1-20 MG-MCG(24) PO CAPS: 20.0000 mg | ORAL_CAPSULE | Freq: Every day | ORAL | 4 refills | Status: DC

## 2018-07-01 MED ORDER — NORETHINDRONE ACET-ETHINYL EST 1.5-30 MG-MCG PO TABS: 1.0000 | ORAL_TABLET | Freq: Every day | ORAL | 11 refills | Status: DC

## 2018-07-01 NOTE — Progress Notes (Signed)
Pt is present today for her annual exam. Pt stated that she is doing well and no complaints. Pt stated that she wants a prescription of the samples Taytulla that she was given on 06/04/18 when she had her IUD removed.

## 2018-07-01 NOTE — Addendum Note (Signed)
Addended by: Fabian November on: 07/01/2018 10:41 AM   Modules accepted: Orders

## 2018-07-01 NOTE — Telephone Encounter (Signed)
Patient called stating the birth control sent in is too expensive. She would like something else cheaper sent in. Thanks

## 2018-07-01 NOTE — Patient Instructions (Signed)

## 2018-07-01 NOTE — Telephone Encounter (Signed)
Lurena Joiner, from the pharmacy, just called stating the patient has 2 options for her BCPs and when the tech tried to explain placebo continuous coverage vs no placebo the patient was confused so she is asking if her provider can call and let her know which script to fill for the patient; either continuous treatment or noncontinuous treatment for her BCPs, and Rebecca's number at the pharmacy is [phone], please advise, thanks.

## 2018-07-01 NOTE — Progress Notes (Addendum)
GYNECOLOGY ANNUAL PHYSICAL EXAM PROGRESS NOTE  Subjective:    Barbara Mendez [place] is a 27 y.o. [phone] female who presents for an annual exam. The patient has no complaints today. The patient is sexually active. The patient wears seatbelts: yes. The patient participates in regular exercise: no. Has the patient ever been transfused or tattooed?: not asked. The patient reports that there is not domestic violence in her life.   1.  She reports that since her IUD was taken out last month and started her first month of Taytulla (sample given) that her most recent menstrual cycle has lasted for 2 weeks.  Denies missing any pills. Notes that she and partner did not use a back up method for the first 2 weeks as instructed.    Gynecologic History Menarche age: 38 Patient's last menstrual period was 04/09/2016 (exact date). Contraception: OCPs History of STI's: Denies Last Pap:  12/16/2014. Results were: normal.  Denies h/o abnormal pap smears.    OB History  Gravida Para Term Preterm AB Living  3 2 2  0 1 2  SAB TAB Ectopic Multiple Live Births  1 0 0 0 2    # Outcome Date GA Lbr Len/2nd Weight Sex Delivery Anes PTL Lv  3 Term 06/13/15 [redacted]w[redacted]d  5 lb 15.6 oz (2.71 kg) F CS-LTranv Spinal  LIV     Name: CAMPBELL,GIRL Titilayo     Apgar1: 8  Apgar5: 9  2 SAB 08/24/14     SAB   FD  1 Term 2010   8 lb (3.629 kg) F CS-LTranv  N LIV    Past Medical History:  Diagnosis Date  . Hypotension   . Kidney stone 06/02/2018    Past Surgical History:  Procedure Laterality Date  . CESAREAN SECTION    . CESAREAN SECTION N/A 06/13/2015   Procedure: CESAREAN SECTION;  Surgeon: Hildred Laser, MD;  Location: [facility] ORS;  Service: Obstetrics;  Laterality: N/A;    Family History  Problem Relation Age of Onset  . Hypertension Father   . Breast cancer Neg Hx   . Ovarian cancer Neg Hx   . Colon cancer Neg Hx   . Diabetes Neg Hx     Social History   Socioeconomic History  . Marital status: Married   Spouse name: Not on file  . Number of children: Not on file  . Years of education: Not on file  . Highest education level: Not on file  Occupational History  . Not on file  Social Needs  . Financial resource strain: Not on file  . Food insecurity:    Worry: Not on file    Inability: Not on file  . Transportation needs:    Medical: Not on file    Non-medical: Not on file  Tobacco Use  . Smoking status: Current Every Day Smoker    Packs/day: 0.25    Types: Cigars    Last attempt to quit: 09/24/2014    Years since quitting: 3.7  . Smokeless tobacco: Never Used  Substance and Sexual Activity  . Alcohol use: Yes    Comment: occass  . Drug use: Not Currently    Types: Marijuana  . Sexual activity: Yes    Birth control/protection: None, Pill    Comment: mirena   Lifestyle  . Physical activity:    Days per week: Not on file    Minutes per session: Not on file  . Stress: Not on file  Relationships  . Social connections:  Talks on phone: Not on file    Gets together: Not on file    Attends religious service: Not on file    Active member of club or organization: Not on file    Attends meetings of clubs or organizations: Not on file    Relationship status: Not on file  . Intimate partner violence:    Fear of current or ex partner: Not on file    Emotionally abused: Not on file    Physically abused: Not on file    Forced sexual activity: Not on file  Other Topics Concern  . Not on file  Social History Narrative  . Not on file    No current outpatient medications on file prior to visit.   No current facility-administered medications on file prior to visit.     No Known Allergies  Review of Systems Constitutional: negative for chills, fatigue, fevers and sweats Eyes: negative for irritation, redness and visual disturbance Ears, nose, mouth, throat, and face: negative for hearing loss, nasal congestion, snoring and tinnitus Respiratory: negative for asthma, cough,  sputum Cardiovascular: negative for chest pain, dyspnea, exertional chest pressure/discomfort, irregular heart beat, palpitations and syncope Gastrointestinal: negative for abdominal pain, change in bowel habits, nausea and vomiting Genitourinary: Positive for abnormal menstrual period (x 1).  No genital lesions, sexual problems and vaginal discharge, pelvic pain, dysuria and urinary incontinence Integument/breast: negative for breast lump, breast tenderness and nipple discharge Hematologic/lymphatic: negative for bleeding and easy bruising Musculoskeletal:negative for back pain and muscle weakness Neurological: negative for dizziness, headaches, vertigo and weakness Endocrine: negative for diabetic symptoms including polydipsia, polyuria and skin dryness Allergic/Immunologic: negative for hay fever and urticaria   Psychological: negative for anxiety, depression, concentration difficulties, mood swings or suicidal ideation.     Objective:  Blood pressure 138/84, pulse 92, height 5\' 5"  (1.651 m), weight 153 lb 3.2 oz (69.5 kg), last menstrual period 06/17/2018. Body mass index is 25.49 kg/m.  General Appearance:    Alert, cooperative, no distress, appears stated age, overweight  Head:    Normocephalic, without obvious abnormality, atraumatic  Eyes:    PERRL, conjunctiva/corneas clear, EOM's intact, both eyes  Ears:    Normal external ear canals, both ears  Nose:   Nares normal, septum midline, mucosa normal, no drainage or sinus tenderness  Throat:   Lips, mucosa, and tongue normal; teeth and gums normal  Neck:   Supple, symmetrical, trachea midline, no adenopathy; thyroid: no enlargement/tenderness/nodules; no carotid bruit or JVD  Back:     Symmetric, no curvature, ROM normal, no CVA tenderness  Lungs:     Clear to auscultation bilaterally, respirations unlabored  Chest Wall:    No tenderness or deformity   Heart:    Regular rate and rhythm, S1 and S2 normal, no murmur, rub or gallop    Breast Exam:    No tenderness, masses, or nipple abnormality  Abdomen:     Soft, non-tender, bowel sounds active all four quadrants, no masses, no organomegaly.    Genitalia:    Pelvic:external genitalia normal, vagina without lesions, discharge, or tenderness.  Dark red blood in vaginal vault.  Rectovaginal septum  normal. Cervix normal in appearance, no cervical motion tenderness, no adnexal masses or tenderness.  IUD strings visible, ~ 3 cm in length. Uterus normal size, shape, mobile, regular contours, nontender.  Rectal:    Normal external sphincter.  No hemorrhoids appreciated. Internal exam not done.   Extremities:   Extremities normal, atraumatic, no cyanosis or edema  Pulses:   2+ and symmetric all extremities  Skin:   Skin color, texture, turgor normal, no rashes or lesions  Lymph nodes:   Cervical, supraclavicular, and axillary nodes normal  Neurologic:   CNII-XII intact, normal strength, sensation and reflexes throughout  Psychologic:   Normal thought process, speech.     .  Labs:  Lab Results  Component Value Date   WBC 10.6 06/02/2018   HGB 13.3 06/02/2018   HCT 39.0 06/02/2018   MCV 90.4 06/02/2018   PLT 342 06/02/2018    Lab Results  Component Value Date   CREATININE 0.65 06/02/2018   BUN 12 06/02/2018   NA 140 06/02/2018   K 3.8 06/02/2018   CL 108 06/02/2018   CO2 26 06/02/2018    Lab Results  Component Value Date   ALT 12 06/02/2018   AST 13 (L) 06/02/2018   ALKPHOS 50 06/02/2018   BILITOT 0.6 06/02/2018    No results found for: TSH   Assessment:    Healthy female exam.   Overweight  Contraception management  Abnormal menses  Plan:    - Blood tests: Labs reviewed. - Breast self exam technique reviewed and patient encouraged to perform self-exam monthly. - Contraception: OCPs. New prescription given for Junel 1-30 (due to insurance) and to help bleeding.  - Abnormal menses x 1.  May be secondary to change in hormonal contraception, but will  r/o pregnancy today with UPT. If UPT negative, can continue use of OCPs. Can change dosing if abnormal bleeding continues.  - Discussed healthy lifestyle modifications. - Pap smear performed today.    - Declined flu vaccine - Follow up in 1 year.     Hildred Laser, MD [facility]

## 2018-07-02 LAB — CYTOLOGY - PAP: Diagnosis: NEGATIVE

## 2018-07-02 NOTE — Telephone Encounter (Signed)
Spoke with Lurena Joiner and the medication was filled.

## 2019-02-07 IMAGING — US US PELV - US TRANSVAGINAL
2 series · 13 of 25 positions shown · non-contrast
Comparison: None

ADDENDUM:
Low resistance arterial Doppler flow is documented in both ovaries.
Normal venous Doppler flow is noted in both ovaries. Color Doppler
flow is symmetrical in both ovaries. There is no evidence of ovarian
torsion.
CLINICAL DATA: Pelvic pain



[Series 1: us pelv - us transvaginal · 12 of 83 slices shown (1 of 2)]
[im 1/83]
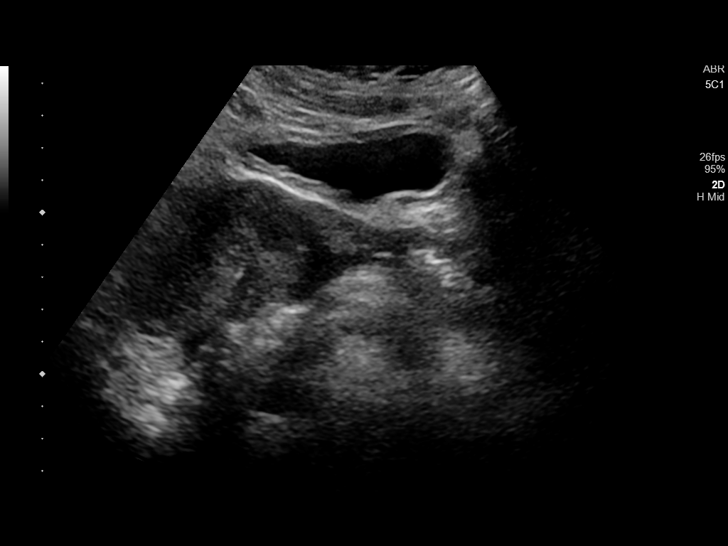
[im 8/83]
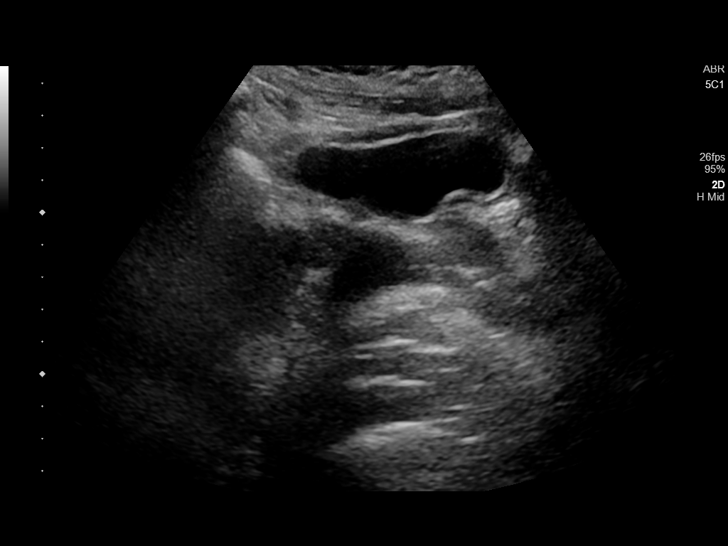
[im 15/83]
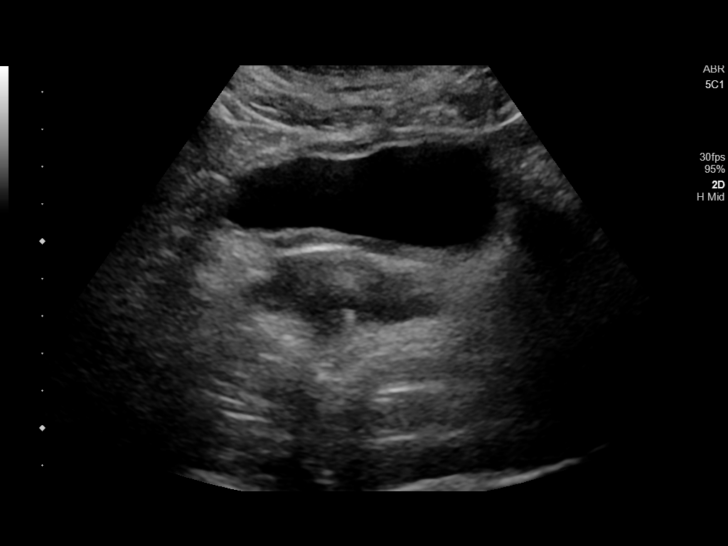
[im 23/83]
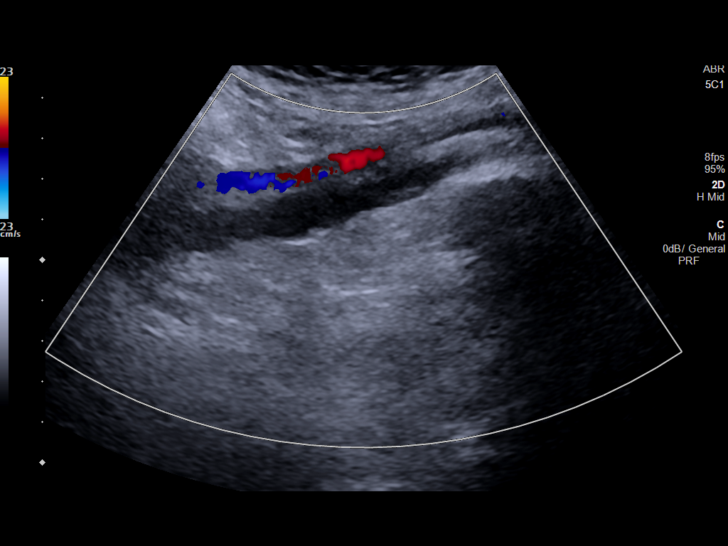
[im 30/83]
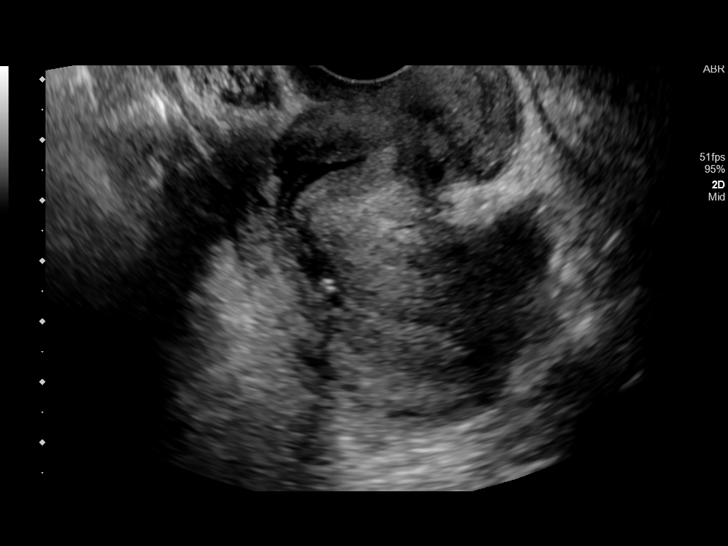
[im 38/83]
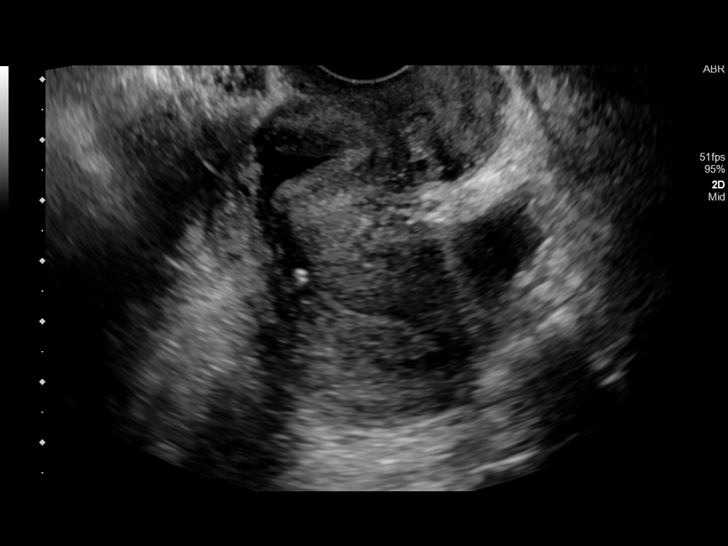
[im 45/83]
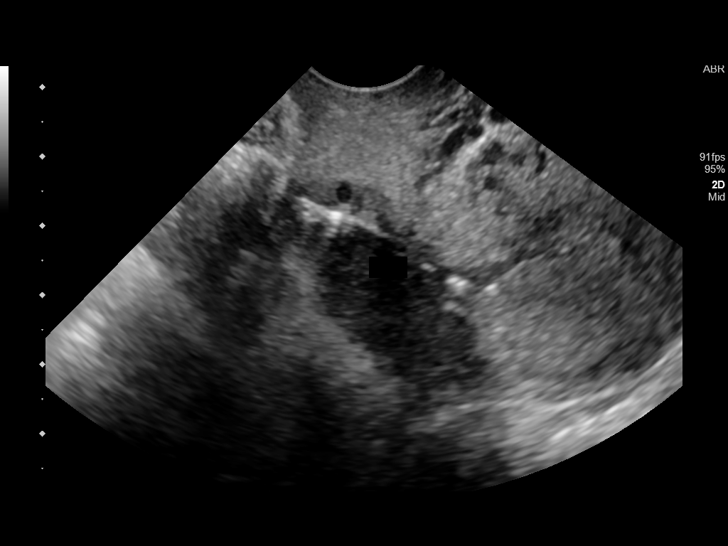
[im 53/83]
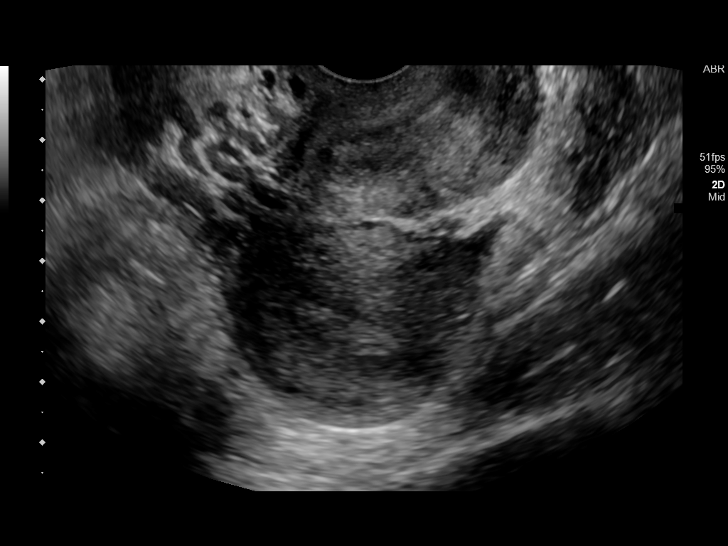
[im 60/83]
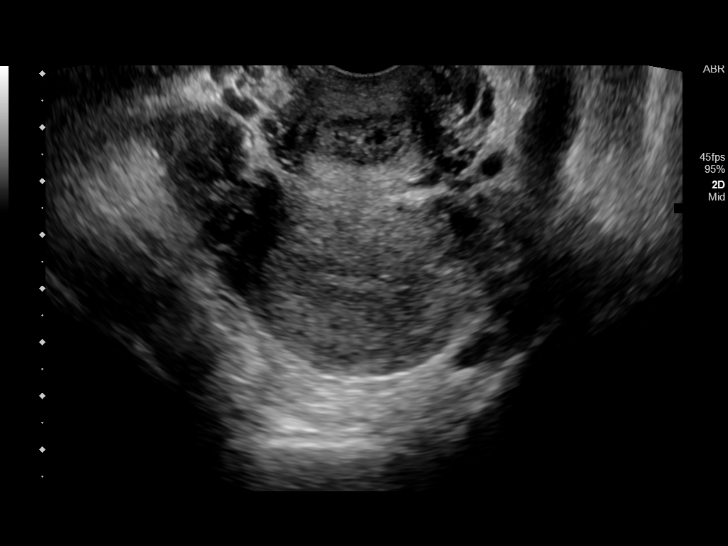
[im 68/83]
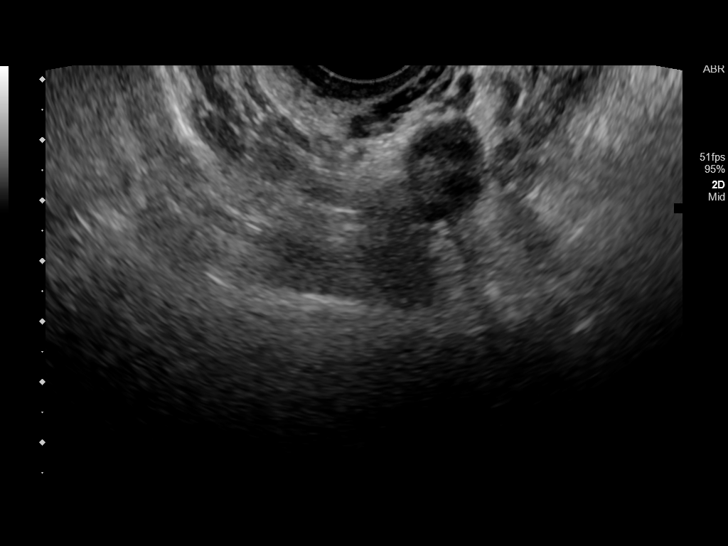
[im 75/83]
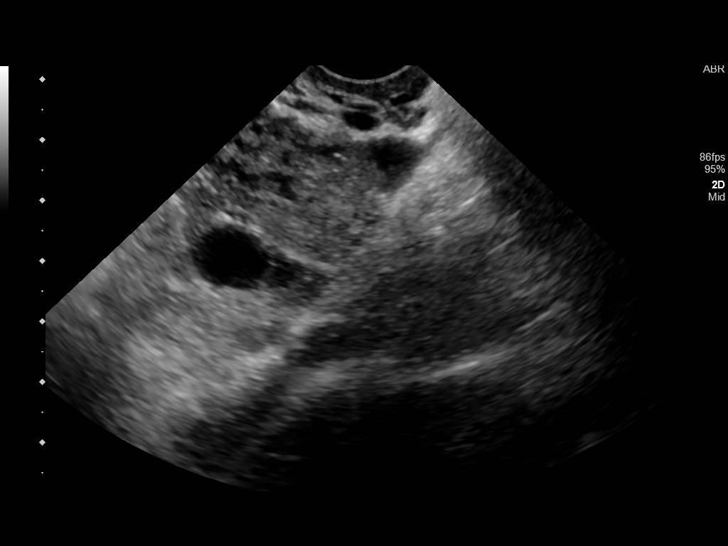
[im 83/83]
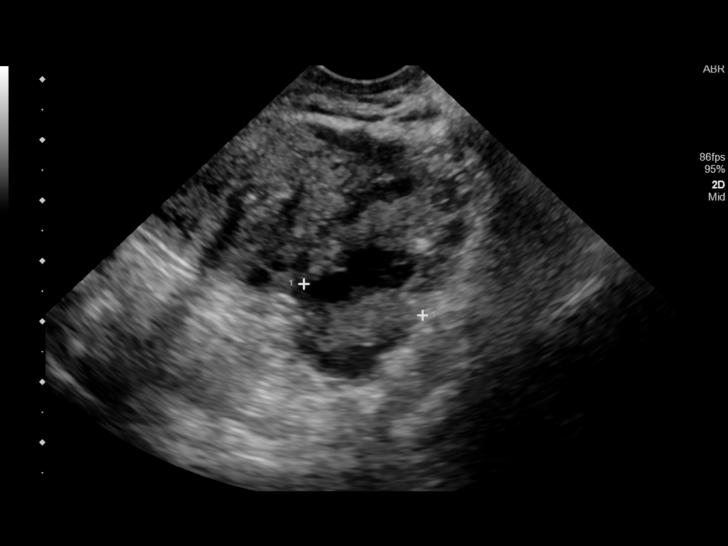

[Series 1001: us pelv - us transvaginal · 1 of 9 slices shown (2 of 2)]
[im 9/9]
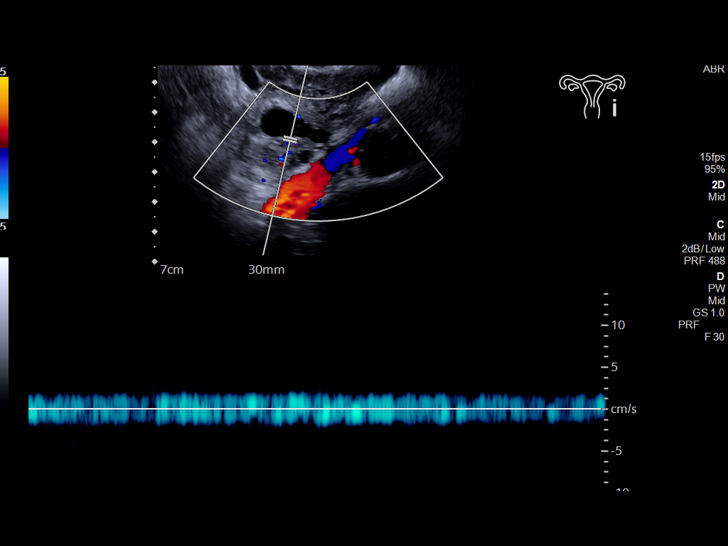

[13 of 25 positions shown; findings below may reference images not displayed]

FINDINGS: Uterus

Measurements: 9.7 x 4.5 x 5.9 cm. No myometrial mass. The uterus is
retroverted.

Endometrium

Thickness: 4 mm. An IUD is present in the endometrial canal of the
lower uterine segment and at the region of the cervix. There is
fluid within the endometrial canal.

Right ovary

Measurements: 3.8 x 1.9 x 1.9 cm. Normal appearance/no adnexal mass.

Left ovary

Measurements: 2.2 x 2.0 x 2.1 cm. Normal appearance/no adnexal mass.

Other findings

There is a small amount of free fluid.
IMPRESSION: An IUD is present in the lower uterine segment and region of the
cervix. There is a small amount of associated endometrial fluid.

Ovaries are within normal limits.  No pelvic mass.

## 2019-07-24 ENCOUNTER — Other Ambulatory Visit: Payer: Self-pay

## 2019-07-24 ENCOUNTER — Ambulatory Visit (INDEPENDENT_AMBULATORY_CARE_PROVIDER_SITE_OTHER): Payer: Self-pay | Admitting: Obstetrics and Gynecology

## 2019-07-24 ENCOUNTER — Encounter: Payer: Self-pay | Admitting: Obstetrics and Gynecology

## 2019-07-24 VITALS — BP 126/90 | HR 80 | Ht 65.0 in | Wt 163.2 lb

## 2019-07-24 DIAGNOSIS — R030 Elevated blood-pressure reading, without diagnosis of hypertension: Secondary | ICD-10-CM

## 2019-07-24 DIAGNOSIS — Z01419 Encounter for gynecological examination (general) (routine) without abnormal findings: Secondary | ICD-10-CM

## 2019-07-24 DIAGNOSIS — R21 Rash and other nonspecific skin eruption: Secondary | ICD-10-CM

## 2019-07-24 DIAGNOSIS — E663 Overweight: Secondary | ICD-10-CM

## 2019-07-24 DIAGNOSIS — Z3041 Encounter for surveillance of contraceptive pills: Secondary | ICD-10-CM

## 2019-07-24 MED ORDER — NORETHINDRONE ACET-ETHINYL EST 1.5-30 MG-MCG PO TABS: 1.0000 | ORAL_TABLET | Freq: Every day | ORAL | 3 refills | Status: DC

## 2019-07-24 NOTE — Progress Notes (Signed)
Pt presents for refill of OCPs. She states she is doing well on this. She notes some nausea, but notes this is not intolerable. She takes her pills in the morning time because she feels she will forget if she takes in the evening. She also has concerns regarding a rash on the right superior medial aspect of the inner thigh. She notes it has been present approximately one year and thought it was due to the OCPs. She notes it itches on occasion when she scratches it. Has not tried any home OTC remedies.

## 2019-07-24 NOTE — Progress Notes (Signed)
GYNECOLOGY ANNUAL PHYSICAL EXAM PROGRESS NOTE  Subjective:    Barbara Mendez [place] is a 28 y.o. [phone] female who presents for an annual exam. The patient is sexually active. The patient wears seatbelts: yes. The patient participates in regular exercise: no. Has the patient ever been transfused or tattooed?: not asked. The patient reports that there is not domestic violence in her life.   The patient has the following complaints today. 1.  Notes that she thinks that she has a rash in her groin caused by her OCPs (has been there on. Also noting some mild nausea from OCPs but this is tolerable. Has tried switching to taking at night but notes she will sometimes forget a pill so has gone back to daytime dosing.    Gynecologic History Menarche age: 89 Patient's last menstrual period was 06/27/2019. Contraception: OCPs History of STI's: Denies Last Pap:  07/01/2018. Results were: normal.  Denies h/o abnormal pap smears.    OB History  Gravida Para Term Preterm AB Living  3 2 2  0 1 2  SAB TAB Ectopic Multiple Live Births  1 0 0 0 2    # Outcome Date GA Lbr Len/2nd Weight Sex Delivery Anes PTL Lv  3 Term 06/13/15 [redacted]w[redacted]d  5 lb 15.6 oz (2.71 kg) F CS-LTranv Spinal  LIV     Name: CAMPBELL,GIRL Metta     Apgar1: 8  Apgar5: 9  2 SAB 08/24/14     SAB   FD  1 Term 2010   8 lb (3.629 kg) F CS-LTranv  N LIV    Past Medical History:  Diagnosis Date  . Hypotension   . Kidney stone 06/02/2018    Past Surgical History:  Procedure Laterality Date  . CESAREAN SECTION    . CESAREAN SECTION N/A 06/13/2015   Procedure: CESAREAN SECTION;  Surgeon: 06/15/2015, MD;  Location: [facility] ORS;  Service: Obstetrics;  Laterality: N/A;    Family History  Problem Relation Age of Onset  . Hypertension Father   . Breast cancer Neg Hx   . Ovarian cancer Neg Hx   . Colon cancer Neg Hx   . Diabetes Neg Hx     Social History   Socioeconomic History  . Marital status: Married    Spouse name: Not on  file  . Number of children: Not on file  . Years of education: Not on file  . Highest education level: Not on file  Occupational History  . Not on file  Social Needs  . Financial resource strain: Not on file  . Food insecurity    Worry: Not on file    Inability: Not on file  . Transportation needs    Medical: Not on file    Non-medical: Not on file  Tobacco Use  . Smoking status: Current Every Day Smoker    Packs/day: 0.25    Types: Cigars    Last attempt to quit: 09/24/2014    Years since quitting: 4.8  . Smokeless tobacco: Never Used  Substance and Sexual Activity  . Alcohol use: Yes    Comment: occass  . Drug use: Not Currently    Types: Marijuana  . Sexual activity: Yes    Birth control/protection: None, Pill    Comment: mirena   Lifestyle  . Physical activity    Days per week: Not on file    Minutes per session: Not on file  . Stress: Not on file  Relationships  . Social connections  Talks on phone: Not on file    Gets together: Not on file    Attends religious service: Not on file    Active member of club or organization: Not on file    Attends meetings of clubs or organizations: Not on file    Relationship status: Not on file  . Intimate partner violence    Fear of current or ex partner: Not on file    Emotionally abused: Not on file    Physically abused: Not on file    Forced sexual activity: Not on file  Other Topics Concern  . Not on file  Social History Narrative  . Not on file    Current Outpatient Medications on File Prior to Visit  Medication Sig Dispense Refill  . Norethindrone Acetate-Ethinyl Estradiol (JUNEL,LOESTRIN,MICROGESTIN) 1.5-30 MG-MCG tablet Take 1 tablet by mouth daily. 1 Package 11   No current facility-administered medications on file prior to visit.     No Known Allergies  Review of Systems Constitutional: negative for chills, fatigue, fevers and sweats Eyes: negative for irritation, redness and visual disturbance Ears,  nose, mouth, throat, and face: negative for hearing loss, nasal congestion, snoring and tinnitus Respiratory: negative for asthma, cough, sputum Cardiovascular: negative for chest pain, dyspnea, exertional chest pressure/discomfort, irregular heart beat, palpitations and syncope Gastrointestinal: negative for abdominal pain, change in bowel habits, nausea and vomiting Genitourinary:  No genital lesions, sexual problems and vaginal discharge, pelvic pain, dysuria and urinary incontinence. Integument/breast: negative for breast lump, breast tenderness and nipple discharge.  Hematologic/lymphatic: negative for bleeding and easy bruising Musculoskeletal:negative for back pain and muscle weakness Neurological: negative for dizziness, headaches, vertigo and weakness Endocrine: negative for diabetic symptoms including polydipsia, polyuria and skin dryness Allergic/Immunologic: negative for hay fever and urticaria   Psychological: negative for anxiety, depression, concentration difficulties, mood swings or suicidal ideation.  Dermatological ROS: positive for pruritus and rash (darkening of skin on right groin). Negative for eczema and skin lesion changes     Objective:  Blood pressure (!) 149/86, pulse 87, height 5\' 5"  (1.651 m), weight 163 lb 3.2 oz (74 kg), last menstrual period 06/27/2019. Body mass index is 27.16 kg/m.   Repeat BP 129/90.   General Appearance:    Alert, cooperative, no distress, appears stated age, overweight  Head:    Normocephalic, without obvious abnormality, atraumatic  Eyes:    PERRL, conjunctiva/corneas clear, EOM's intact, both eyes  Ears:    Normal external ear canals, both ears  Nose:   Nares normal, septum midline, mucosa normal, no drainage or sinus tenderness  Throat:   Lips, mucosa, and tongue normal; teeth and gums normal  Neck:   Supple, symmetrical, trachea midline, no adenopathy; thyroid: no enlargement/tenderness/nodules; no carotid bruit or JVD  Back:      Symmetric, no curvature, ROM normal, no CVA tenderness  Lungs:     Clear to auscultation bilaterally, respirations unlabored  Chest Wall:    No tenderness or deformity   Heart:    Regular rate and rhythm, S1 and S2 normal, no murmur, rub or gallop  Breast Exam:    No tenderness, masses, or nipple abnormality  Abdomen:     Soft, non-tender, bowel sounds active all four quadrants, no masses, no organomegaly.    Genitalia:    Pelvic:external genitalia normal, vagina without lesions, discharge, or tenderness.  Dark red blood in vaginal vault.  Rectovaginal septum  normal. Cervix normal in appearance, no cervical motion tenderness, no adnexal masses or tenderness.  IUD  strings visible, ~ 3 cm in length. Uterus normal size, shape, mobile, regular contours, nontender.  Rectal:    Normal external sphincter.  No hemorrhoids appreciated. Internal exam not done.   Extremities:   Extremities normal, atraumatic, no cyanosis or edema  Pulses:   2+ and symmetric all extremities  Skin:   Skin color, texture, turgor normal, or lesions.  Area of hyperpigmentation noted in groin area, slightly thickened skin.   Lymph nodes:   Cervical, supraclavicular, and axillary nodes normal  Neurologic:   CNII-XII intact, normal strength, sensation and reflexes throughout  Psychologic:   Normal thought process, speech.     .  Labs:  Lab Results  Component Value Date   WBC 10.6 06/02/2018   HGB 13.3 06/02/2018   HCT 39.0 06/02/2018   MCV 90.4 06/02/2018   PLT 342 06/02/2018    Lab Results  Component Value Date   CREATININE 0.65 06/02/2018   BUN 12 06/02/2018   NA 140 06/02/2018   K 3.8 06/02/2018   CL 108 06/02/2018   CO2 26 06/02/2018    Lab Results  Component Value Date   ALT 12 06/02/2018   AST 13 (L) 06/02/2018   ALKPHOS 50 06/02/2018   BILITOT 0.6 06/02/2018    No results found for: TSH   Assessment:    Healthy female exam.   Overweight  Contraception management  Elevated BP  Rash of groin   Plan:    - Blood tests: Labs ordered. - Breast self exam technique reviewed and patient encouraged to perform self-exam monthly. - Contraception: OCPs. Refill given for Junel - Discussed healthy lifestyle modifications. - Pap smear up to date.     - Declined flu vaccine.  - Will continue to monitor BP. Advised patient to occasionally check BPs if in grocery store/pharmacy. If still elevated, likely has HTN.  - Advised on using hydrocortisone cream for rash, not Neopsorin.  - Follow up in 1 year for annual exam.     Rubie Maid, MD [facility]

## 2019-07-24 NOTE — Patient Instructions (Signed)
Health Maintenance, Female Adopting a healthy lifestyle and getting preventive care are important in promoting health and wellness. Ask your health care provider about:  The right schedule for you to have regular tests and exams.  Things you can do on your own to prevent diseases and keep yourself healthy. What should I know about diet, weight, and exercise? Eat a healthy diet   Eat a diet that includes plenty of vegetables, fruits, low-fat dairy products, and lean protein.  Do not eat a lot of foods that are high in solid fats, added sugars, or sodium. Maintain a healthy weight Body mass index (BMI) is used to identify weight problems. It estimates body fat based on height and weight. Your health care provider can help determine your BMI and help you achieve or maintain a healthy weight. Get regular exercise Get regular exercise. This is one of the most important things you can do for your health. Most adults should:  Exercise for at least 150 minutes each week. The exercise should increase your heart rate and make you sweat (moderate-intensity exercise).  Do strengthening exercises at least twice a week. This is in addition to the moderate-intensity exercise.  Spend less time sitting. Even light physical activity can be beneficial. Watch cholesterol and blood lipids Have your blood tested for lipids and cholesterol at 30 years of age, then have this test every 5 years. Have your cholesterol levels checked more often if:  Your lipid or cholesterol levels are high.  You are older than 30 years of age.  You are at high risk for heart disease. What should I know about cancer screening? Depending on your health history and family history, you may need to have cancer screening at various ages. This may include screening for:  Breast cancer.  Cervical cancer.  Colorectal cancer.  Skin cancer.  Lung cancer. What should I know about heart disease, diabetes, and high blood  pressure? Blood pressure and heart disease  High blood pressure causes heart disease and increases the risk of stroke. This is more likely to develop in people who have high blood pressure readings, are of African descent, or are overweight.  Have your blood pressure checked: ? Every 3-5 years if you are 18-39 years of age. ? Every year if you are 40 years old or older. Diabetes Have regular diabetes screenings. This checks your fasting blood sugar level. Have the screening done:  Once every three years after age 40 if you are at a normal weight and have a low risk for diabetes.  More often and at a younger age if you are overweight or have a high risk for diabetes. What should I know about preventing infection? Hepatitis B If you have a higher risk for hepatitis B, you should be screened for this virus. Talk with your health care provider to find out if you are at risk for hepatitis B infection. Hepatitis C Testing is recommended for:  Everyone born from 1945 through 1965.  Anyone with known risk factors for hepatitis C. Sexually transmitted infections (STIs)  Get screened for STIs, including gonorrhea and chlamydia, if: ? You are sexually active and are younger than 30 years of age. ? You are older than 30 years of age and your health care provider tells you that you are at risk for this type of infection. ? Your sexual activity has changed since you were last screened, and you are at increased risk for chlamydia or gonorrhea. Ask your health care provider if   you are at risk.  Ask your health care provider about whether you are at high risk for HIV. Your health care provider may recommend a prescription medicine to help prevent HIV infection. If you choose to take medicine to prevent HIV, you should first get tested for HIV. You should then be tested every 3 months for as long as you are taking the medicine. Pregnancy  If you are about to stop having your period (premenopausal) and  you may become pregnant, seek counseling before you get pregnant.  Take 400 to 800 micrograms (mcg) of folic acid every day if you become pregnant.  Ask for birth control (contraception) if you want to prevent pregnancy. Osteoporosis and menopause Osteoporosis is a disease in which the bones lose minerals and strength with aging. This can result in bone fractures. If you are 65 years old or older, or if you are at risk for osteoporosis and fractures, ask your health care provider if you should:  Be screened for bone loss.  Take a calcium or vitamin D supplement to lower your risk of fractures.  Be given hormone replacement therapy (HRT) to treat symptoms of menopause. Follow these instructions at home: Lifestyle  Do not use any products that contain nicotine or tobacco, such as cigarettes, e-cigarettes, and chewing tobacco. If you need help quitting, ask your health care provider.  Do not use street drugs.  Do not share needles.  Ask your health care provider for help if you need support or information about quitting drugs. Alcohol use  Do not drink alcohol if: ? Your health care provider tells you not to drink. ? You are pregnant, may be pregnant, or are planning to become pregnant.  If you drink alcohol: ? Limit how much you use to 0-1 drink a day. ? Limit intake if you are breastfeeding.  Be aware of how much alcohol is in your drink. In the [place] one drink equals one 12 oz bottle of beer (355 mL), one 5 oz glass of wine (148 mL), or one 1 oz glass of hard liquor (44 mL). General instructions  Schedule regular health, dental, and eye exams.  Stay current with your vaccines.  Tell your health care provider if: ? You often feel depressed. ? You have ever been abused or do not feel safe at home. Summary  Adopting a healthy lifestyle and getting preventive care are important in promoting health and wellness.  Follow your health care provider's instructions about healthy  diet, exercising, and getting tested or screened for diseases.  Follow your health care provider's instructions on monitoring your cholesterol and blood pressure. This information is not intended to replace advice given to you by your health care provider. Make sure you discuss any questions you have with your health care provider. Document Released: 03/26/2011 Document Revised: 09/03/2018 Document Reviewed: 09/03/2018 Elsevier Patient Education  2020 [employer]  

## 2019-07-24 NOTE — Progress Notes (Signed)
Pt is present for birth control refill. Pt stated that she is doing well no problems

## 2019-07-25 LAB — CBC
Hematocrit: 38.1 % (ref 34.0–46.6)
Hemoglobin: 12.8 g/dL (ref 11.1–15.9)
MCH: 30.4 pg (ref 26.6–33.0)
MCHC: 33.6 g/dL (ref 31.5–35.7)
MCV: 91 fL (ref 79–97)
Platelets: 312 10*3/uL (ref 150–450)
RBC: 4.21 x10E6/uL (ref 3.77–5.28)
RDW: 13 % (ref 11.7–15.4)
WBC: 7.8 10*3/uL (ref 3.4–10.8)

## 2020-05-10 ENCOUNTER — Emergency Department: Payer: Self-pay

## 2020-05-10 ENCOUNTER — Other Ambulatory Visit: Payer: Self-pay

## 2020-05-10 ENCOUNTER — Emergency Department
Admission: EM | Admit: 2020-05-10 | Discharge: 2020-05-10 | Disposition: A | Discharge status: Home / Self Care | Payer: Self-pay | Attending: Emergency Medicine | Admitting: Emergency Medicine

## 2020-05-10 DIAGNOSIS — N201 Calculus of ureter: Secondary | ICD-10-CM | POA: Insufficient documentation

## 2020-05-10 DIAGNOSIS — F17290 Nicotine dependence, other tobacco product, uncomplicated: Secondary | ICD-10-CM | POA: Insufficient documentation

## 2020-05-10 LAB — BASIC METABOLIC PANEL
Anion gap: 7 (ref 5–15)
BUN: 9 mg/dL (ref 6–20)
CO2: 24 mmol/L (ref 22–32)
Calcium: 8.7 mg/dL — ABNORMAL LOW (ref 8.9–10.3)
Chloride: 105 mmol/L (ref 98–111)
Creatinine, Ser: 0.67 mg/dL (ref 0.44–1.00)
GFR calc Af Amer: >60 mL/min (ref >60)
GFR calc non Af Amer: >60 mL/min (ref >60)
Glucose, Bld: 93 mg/dL (ref 70–99)
Potassium: 3.8 mmol/L (ref 3.5–5.1)
Sodium: 136 mmol/L (ref 135–145)

## 2020-05-10 LAB — URINALYSIS, COMPLETE (UACMP) WITH MICROSCOPIC
Bacteria, UA: NONE SEEN
Bilirubin Urine: NEGATIVE
Glucose, UA: NEGATIVE mg/dL
Ketones, ur: NEGATIVE mg/dL
Leukocytes,Ua: NEGATIVE
Nitrite: NEGATIVE
Protein, ur: NEGATIVE mg/dL
Specific Gravity, Urine: 1.017 (ref 1.005–1.030)
pH: 7 (ref 5.0–8.0)

## 2020-05-10 LAB — CBC
HCT: 39 % (ref 36.0–46.0)
Hemoglobin: 13.2 g/dL (ref 12.0–15.0)
MCH: 30.8 pg (ref 26.0–34.0)
MCHC: 33.8 g/dL (ref 30.0–36.0)
MCV: 91.1 fL (ref 80.0–100.0)
Platelets: 320 10*3/uL (ref 150–400)
RBC: 4.28 MIL/uL (ref 3.87–5.11)
RDW: 12.6 % (ref 11.5–15.5)
WBC: 10.6 10*3/uL — ABNORMAL HIGH (ref 4.0–10.5)
nRBC: 0 % (ref 0.0–0.2)

## 2020-05-10 LAB — POCT PREGNANCY, URINE: Preg Test, Ur: NEGATIVE

## 2020-05-10 MED ORDER — OXYCODONE-ACETAMINOPHEN 5-325 MG PO TABS: 1.0000 | ORAL_TABLET | Freq: Three times a day (TID) | ORAL | 0 refills | Status: DC | PRN

## 2020-05-10 MED ORDER — ONDANSETRON HCL 4 MG/2ML IJ SOLN
4.0000 mg | Freq: Once | INTRAMUSCULAR | Status: AC
  Administered 2020-05-10: 4 mg via INTRAVENOUS
  Filled 2020-05-10: qty 2

## 2020-05-10 MED ORDER — SODIUM CHLORIDE 0.9 % IV SOLN
1000.0000 mL | Freq: Once | INTRAVENOUS | Status: AC
  Administered 2020-05-10: 1000 mL via INTRAVENOUS

## 2020-05-10 MED ORDER — MORPHINE SULFATE (PF) 4 MG/ML IV SOLN
4.0000 mg | Freq: Once | INTRAVENOUS | Status: AC
  Administered 2020-05-10: 4 mg via INTRAVENOUS
  Filled 2020-05-10: qty 1

## 2020-05-10 MED ORDER — KETOROLAC TROMETHAMINE 30 MG/ML IJ SOLN
30.0000 mg | Freq: Once | INTRAMUSCULAR | Status: AC
  Administered 2020-05-10: 30 mg via INTRAVENOUS
  Filled 2020-05-10: qty 1

## 2020-05-10 NOTE — ED Provider Notes (Signed)
[facility] Emergency Department Provider Note   ____________________________________________    I have reviewed the triage vital signs and the nursing notes.   HISTORY  Chief Complaint Flank Pain     HPI Barbara Mendez is a 29 y.o. female who presents with complaints of left flank pain.  Patient reports he was diagnosed with a kidney stone on the 11th of this month, has been doing well until pain developed last night she describes the pain as sharp radiating towards her groin.  Hematuria noted, no dysuria, no fevers chills.  Positive nausea positive vomiting.  Has taken p.o. pain medications without effect at this time.  Does have a history of kidney stones in the past.  Past Medical History:  Diagnosis Date  . Hypotension   . Kidney stone 06/02/2018    Patient Active Problem List   Diagnosis Date Noted  . Overweight (BMI 25.0-29.9) 06/28/2017  . Current mild episode of major depressive disorder without prior episode (HCC) 06/28/2017  . History of tobacco abuse 03/10/2015    Past Surgical History:  Procedure Laterality Date  . CESAREAN SECTION    . CESAREAN SECTION N/A 06/13/2015   Procedure: CESAREAN SECTION;  Surgeon: Hildred Laser, MD;  Location: [facility] ORS;  Service: Obstetrics;  Laterality: N/A;    Prior to Admission medications   Medication Sig Start Date End Date Taking? Authorizing Provider  Norethindrone Acetate-Ethinyl Estradiol (LOESTRIN) 1.5-30 MG-MCG tablet Take 1 tablet by mouth daily. 07/24/19   Hildred Laser, MD  oxyCODONE-acetaminophen (PERCOCET) 5-325 MG tablet Take 1 tablet by mouth every 8 (eight) hours as needed for severe pain. 05/10/20 05/10/21  Jene Every, MD     Allergies Patient has no known allergies.  Family History  Problem Relation Age of Onset  . Hypertension Father   . Breast cancer Neg Hx   . Ovarian cancer Neg Hx   . Colon cancer Neg Hx   . Diabetes Neg Hx     Social History Social History    Tobacco Use  . Smoking status: Current Every Day Smoker    Packs/day: 0.25    Types: Cigars    Last attempt to quit: 09/24/2014    Years since quitting: 5.6  . Smokeless tobacco: Never Used  Vaping Use  . Vaping Use: Never used  Substance Use Topics  . Alcohol use: Yes    Comment: occass  . Drug use: Not Currently    Types: Marijuana    Review of Systems  Constitutional: No fever/chills Eyes: No visual changes.  ENT: No sore throat. Cardiovascular: Denies chest pain. Respiratory: Denies shortness of breath. Gastrointestinal: As above Genitourinary: As above Musculoskeletal: Negative for back pain. Skin: Negative for rash. Neurological: Negative for headaches   ____________________________________________   PHYSICAL EXAM:  VITAL SIGNS: ED Triage Vitals [05/10/20 1120]  Enc Vitals Group     BP (!) 140/93     Pulse Rate 98     Resp 18     Temp 99.7 F (37.6 C)     Temp Source Oral     SpO2 99 %     Weight 75.8 kg (167 lb)     Height 1.651 m (5\' 5" )     Head Circumference      Peak Flow      Pain Score 9     Pain Loc      Pain Edu?      Excl. in GC?     Constitutional: Alert and oriented.  Nose: No congestion/rhinnorhea. Mouth/Throat: Mucous membranes are moist.    Cardiovascular: Normal rate, regular rhythm.  Good peripheral circulation. Respiratory: Normal respiratory effort.  No retractions. Lungs CTAB. Gastrointestinal: Soft and nontender. No distention.  Mild left CVA tenderness  Musculoskeletal:   Warm and well perfused Neurologic:  Normal speech and language. No gross focal neurologic deficits are appreciated.  Skin:  Skin is warm, dry and intact. No rash noted. Psychiatric: Mood and affect are normal. Speech and behavior are normal.  ____________________________________________   LABS (all labs ordered are listed, but only abnormal results are displayed)  Labs Reviewed  URINALYSIS, COMPLETE (UACMP) WITH MICROSCOPIC - Abnormal;  Notable for the following components:      Result Value   Color, Urine YELLOW (*)    APPearance HAZY (*)    Hgb urine dipstick MODERATE (*)    All other components within normal limits  BASIC METABOLIC PANEL - Abnormal; Notable for the following components:   Calcium 8.7 (*)    All other components within normal limits  CBC - Abnormal; Notable for the following components:   WBC 10.6 (*)    All other components within normal limits  POC URINE PREG, ED  POCT PREGNANCY, URINE   ____________________________________________  EKG  None ____________________________________________  RADIOLOGY  CT renal stone study demonstrates 0.9 cm stone at the left UP junction ____________________________________________   PROCEDURES  Procedure(s) performed: No  Procedures   Critical Care performed: No ____________________________________________   INITIAL IMPRESSION / ASSESSMENT AND PLAN / ED COURSE  Pertinent labs & imaging results that were available during my care of the patient were reviewed by me and considered in my medical decision making (see chart for details).  Patient presents with left flank pain, reports known kidney stone.  Pain is certainly consistent with ureterolithiasis, treated with IV Toradol IV Zofran with significant improvement.  Patient did require a second dose of analgesics, given IV morphine  She is now pain-free.  Discussed with Dr. Lonna Cobb of urology who states his office will call her tomorrow to arrange for lithotripsy this week.  Appropriate for discharge, urinalysis without evidence of infection, afebrile.    ____________________________________________   FINAL CLINICAL IMPRESSION(S) / ED DIAGNOSES  Final diagnoses:  Ureterolithiasis        Note:  This document was prepared using Dragon voice recognition software and may include unintentional dictation errors.   Jene Every, MD 05/10/20 [phone]

## 2020-05-10 NOTE — ED Notes (Signed)
Pt c/o left sided abdominal pain x1 week. Pt reports being seen at Urgent care on the 11th and given pain and nausea medicine to take. Pt stopped taking medicine and now the pain is back. Pt has had 2 episodes of emesis in the last 24 hours and denies any diarrhea.

## 2020-05-10 NOTE — ED Triage Notes (Signed)
Pt arrives to ER c/o L sided flank/abd pain. Hx kidney stones. Vomiting last night. Denies diarrhea, fevers. Denies hematuria but states doctor last week told her there was blood in urine. Denies dysuria.

## 2020-05-11 ENCOUNTER — Ambulatory Visit
Admission: RE | Admit: 2020-05-11 | Discharge: 2020-05-11 | Disposition: A | Discharge status: Home / Self Care | Payer: Self-pay | Source: Ambulatory Visit | Attending: Urology | Admitting: Urology

## 2020-05-11 ENCOUNTER — Encounter: Payer: Self-pay | Admitting: Urology

## 2020-05-11 ENCOUNTER — Ambulatory Visit
Admission: RE | Admit: 2020-05-11 | Discharge: 2020-05-11 | Disposition: A | Discharge status: Home / Self Care | Payer: Self-pay | Attending: Urology | Admitting: Urology

## 2020-05-11 ENCOUNTER — Other Ambulatory Visit: Payer: Self-pay

## 2020-05-11 ENCOUNTER — Other Ambulatory Visit: Payer: Self-pay | Admitting: Urology

## 2020-05-11 ENCOUNTER — Ambulatory Visit (INDEPENDENT_AMBULATORY_CARE_PROVIDER_SITE_OTHER): Payer: Self-pay | Admitting: Urology

## 2020-05-11 VITALS — BP 123/82 | HR 80 | Ht 65.0 in | Wt 167.0 lb

## 2020-05-11 DIAGNOSIS — N201 Calculus of ureter: Secondary | ICD-10-CM | POA: Insufficient documentation

## 2020-05-11 DIAGNOSIS — N135 Crossing vessel and stricture of ureter without hydronephrosis: Secondary | ICD-10-CM

## 2020-05-11 DIAGNOSIS — N200 Calculus of kidney: Secondary | ICD-10-CM

## 2020-05-11 MED ORDER — ONDANSETRON 4 MG PO TBDP
4.0000 mg | ORAL_TABLET | Freq: Three times a day (TID) | ORAL | 0 refills | Status: DC | PRN
Start: 2020-05-11 — End: 2020-07-26

## 2020-05-11 MED ORDER — ONDANSETRON HCL 4 MG/2ML IJ SOLN: 4.0000 mg | Freq: Once | INTRAMUSCULAR | Status: DC

## 2020-05-11 NOTE — Patient Instructions (Signed)
Lithotripsy, Care After This sheet gives you information about how to care for yourself after your procedure. Your health care provider may also give you more specific instructions. If you have problems or questions, contact your health care provider. What can I expect after the procedure? After the procedure, it is common to have:  Some blood in your urine. This should only last for a few days.  Soreness in your back, sides, or upper abdomen for a few days.  Blotches or bruises on your back where the pressure wave entered the skin.  Pain, discomfort, or nausea when pieces (fragments) of the kidney [place] move through the tube that carries urine from the kidney to the bladder (ureter). [place] fragments may pass soon after the procedure, but they may continue to pass for up to 4-8 weeks. ? If you have severe pain or nausea, contact your health care provider. This may be caused by a large [place] that was not broken up, and this may mean that you need more treatment.  Some pain or discomfort during urination.  Some pain or discomfort in the lower abdomen or (in men) at the base of the penis. Follow these instructions at home: Medicines  Take over-the-counter and prescription medicines only as told by your health care provider.  If you were prescribed an antibiotic medicine, take it as told by your health care provider. Do not stop taking the antibiotic even if you start to feel better.  Do not drive for 24 hours if you were given a medicine to help you relax (sedative).  Do not drive or use heavy machinery while taking prescription pain medicine. Eating and drinking      Drink enough water and fluids to keep your urine clear or pale yellow. This helps any remaining pieces of the [place] to pass. It can also help prevent new stones from forming.  Eat plenty of fresh fruits and vegetables.  Follow instructions from your health care provider about eating and drinking restrictions. You may be  instructed: ? To reduce how much salt (sodium) you eat or drink. Check ingredients and nutrition facts on packaged foods and beverages. ? To reduce how much meat you eat.  Eat the recommended amount of calcium for your age and gender. Ask your health care provider how much calcium you should have. General instructions  Get plenty of rest.  Most people can resume normal activities 1-2 days after the procedure. Ask your health care provider what activities are safe for you.  Your health care provider may direct you to lie in a certain position (postural drainage) and tap firmly (percuss) over your kidney area to help [place] fragments pass. Follow instructions as told by your health care provider.  If directed, strain all urine through the strainer that was provided by your health care provider. ? Keep all fragments for your health care provider to see. Any stones that are found may be sent to a medical lab for examination. The [place] may be as small as a grain of salt.  Keep all follow-up visits as told by your health care provider. This is important. Contact a health care provider if:  You have pain that is severe or does not get better with medicine.  You have nausea that is severe or does not go away.  You have blood in your urine longer than your health care provider told you to expect.  You have more blood in your urine.  You have pain during urination that does   not go away.  You urinate more frequently than usual and this does not go away.  You develop a rash or any other possible signs of an allergic reaction. Get help right away if:  You have severe pain in your back, sides, or upper abdomen.  You have severe pain while urinating.  Your urine is very dark red.  You have blood in your stool (feces).  You cannot pass any urine at all.  You feel a strong urge to urinate after emptying your bladder.  You have a fever or chills.  You develop shortness of breath,  difficulty breathing, or chest pain.  You have severe nausea that leads to persistent vomiting.  You faint. Summary  After this procedure, it is common to have some pain, discomfort, or nausea when pieces (fragments) of the kidney stone move through the tube that carries urine from the kidney to the bladder (ureter). If this pain or nausea is severe, however, you should contact your health care provider.  Most people can resume normal activities 1-2 days after the procedure. Ask your health care provider what activities are safe for you.  Drink enough water and fluids to keep your urine clear or pale yellow. This helps any remaining pieces of the stone to pass, and it can help prevent new stones from forming.  If directed, strain your urine and keep all fragments for your health care provider to see. Fragments or stones may be as small as a grain of salt.  Get help right away if you have severe pain in your back, sides, or upper abdomen or have severe pain while urinating. This information is not intended to replace advice given to you by your health care provider. Make sure you discuss any questions you have with your health care provider. Document Revised: 12/22/2018 Document Reviewed: 08/01/2016 Elsevier Patient Education  [employer]. Lithotripsy  Lithotripsy is a treatment that can sometimes help eliminate kidney stones and the pain that they cause. A form of lithotripsy, also known as extracorporeal shock wave lithotripsy, is a nonsurgical procedure that crushes a kidney stone with shock waves. These shock waves pass through your body and focus on the kidney stone. They cause the kidney stone to break up while it is still in the urinary tract. This makes it easier for the smaller pieces of stone to pass in the urine. Tell a health care provider about:  Any allergies you have.  All medicines you are taking, including vitamins, herbs, eye drops, creams, and over-the-counter  medicines.  Any blood disorders you have.  Any surgeries you have had.  Any medical conditions you have.  Whether you are pregnant or may be pregnant.  Any problems you or family members have had with anesthetic medicines. What are the risks? Generally, this is a safe procedure. However, problems may occur, including:  Infection.  Bleeding of the kidney.  Bruising of the kidney or skin.  Scarring of the kidney, which can lead to: ? Increased blood pressure. ? Poor kidney function. ? Return (recurrence) of kidney stones.  Damage to other structures or organs, such as the liver, colon, spleen, or pancreas.  Blockage (obstruction) of the the tube that carries urine from the kidney to the bladder (ureter).  Failure of the kidney stone to break into pieces (fragments). What happens before the procedure? Staying hydrated Follow instructions from your health care provider about hydration, which may include:  Up to 2 hours before the procedure - you may continue  to drink clear liquids, such as water, clear fruit juice, black coffee, and plain tea. Eating and drinking restrictions Follow instructions from your health care provider about eating and drinking, which may include:  8 hours before the procedure - stop eating heavy meals or foods such as meat, fried foods, or fatty foods.  6 hours before the procedure - stop eating light meals or foods, such as toast or cereal.  6 hours before the procedure - stop drinking milk or drinks that contain milk.  2 hours before the procedure - stop drinking clear liquids. General instructions  Plan to have someone take you home from the hospital or clinic.  Ask your health care provider about: ? Changing or stopping your regular medicines. This is especially important if you are taking diabetes medicines or blood thinners. ? Taking medicines such as aspirin and ibuprofen. These medicines and other NSAIDs can thin your blood. Do not take  these medicines for 7 days before your procedure if your health care provider instructs you not to.  You may have tests, such as: ? Blood tests. ? Urine tests. ? Imaging tests, such as a CT scan. What happens during the procedure?  To lower your risk of infection: ? Your health care team will wash or sanitize their hands. ? Your skin will be washed with soap.  An IV tube will be inserted into one of your veins. This tube will give you fluids and medicines.  You will be given one or more of the following: ? A medicine to help you relax (sedative). ? A medicine to make you fall asleep (general anesthetic).  A water-filled cushion may be placed behind your kidney or on your abdomen. In some cases you may be placed in a tub of lukewarm water.  Your body will be positioned in a way that makes it easy to target the kidney stone.  A flexible tube with holes in it (stent) may be placed in the ureter. This will help keep urine flowing from the kidney if the fragments of the stone have been blocking the ureter.  An X-ray or ultrasound exam will be done to locate your stone.  Shock waves will be aimed at the stone. If you are awake, you may feel a tapping sensation as the shock waves pass through your body. The procedure may vary among health care providers and hospitals. What happens after the procedure?  You may have an X-ray to see whether the procedure was able to break up the kidney stone and how much of the stone has passed. If large stone fragments remain after treatment, you may need to have a second procedure at a later time.  Your blood pressure, heart rate, breathing rate, and blood oxygen level will be monitored until the medicines you were given have worn off.  You may be given antibiotics or pain medicine as needed.  If a stent was placed in your ureter during surgery, it may stay in place for a few weeks.  You may need strain your urine to collect pieces of the kidney stone  for testing.  You will need to drink plenty of water.  Do not drive for 24 hours if you were given a sedative. Summary  Lithotripsy is a treatment that can sometimes help eliminate kidney stones and the pain that they cause.  A form of lithotripsy, also known as extracorporeal shock wave lithotripsy, is a nonsurgical procedure that crushes a kidney stone with shock waves.  Generally, this  is a safe procedure. However, problems may occur, including damage to the kidney or other organs, infection, or obstruction of the tube that carries urine from the kidney to the bladder (ureter).  When you go home, you will need to drink plenty of water. You may be asked to strain your urine to collect pieces of the kidney stone for testing. This information is not intended to replace advice given to you by your health care provider. Make sure you discuss any questions you have with your health care provider. Document Revised: 12/22/2018 Document Reviewed: 08/01/2016 Elsevier Patient Education  2020 [employer].

## 2020-05-11 NOTE — H&P (View-Only) (Signed)
05/11/20 5:27 PM   Barbara Mendez [place] 1991-02-02 [id number]  CC: Left flank pain, nausea vomiting  HPI: I saw [name] in urology clinic today for evaluation of left flank pain and nausea vomiting. She was seen in the ED yesterday after having a week of left-sided abdominal pain and nausea and a CT was performed showing a 9 mm left proximal ureteral stone with upstream hydronephrosis. Lab work showed no evidence of UTI, she was given narcotic pain medication and discharged with close urology follow-up. She denies any fevers, chills, or dysuria. She reports 1 prior stone episode which she passed spontaneously. No prior urologic surgeries. She denies any NSAID use in the last week.   PMH: Past Medical History:  Diagnosis Date  . Hypotension   . Kidney stone 06/02/2018    Surgical History: Past Surgical History:  Procedure Laterality Date  . CESAREAN SECTION    . CESAREAN SECTION N/A 06/13/2015   Procedure: CESAREAN SECTION;  Surgeon: Hildred Laser, MD;  Location: [facility] ORS;  Service: Obstetrics;  Laterality: N/A;    Family History: Family History  Problem Relation Age of Onset  . Hypertension Father   . Breast cancer Neg Hx   . Ovarian cancer Neg Hx   . Colon cancer Neg Hx   . Diabetes Neg Hx     Social History:  reports that she has been smoking cigars. She has been smoking about 0.25 packs per day. She has never used smokeless tobacco. She reports current alcohol use. She reports previous drug use. Drug: Marijuana.  Physical Exam: BP 123/82   Pulse 80   Ht 5\' 5"  (1.651 m)   Wt 167 lb (75.8 kg)   LMP 05/03/2020   BMI 27.79 kg/m    Constitutional: Appears uncomfortable Cardiovascular: No clubbing, cyanosis, or edema. Respiratory: Normal respiratory effort, no increased work of breathing. GI: Abdomen is soft, nontender, nondistended, no abdominal masses  Laboratory Data: Reviewed, see HPI  Pertinent Imaging: I have personally reviewed the CT dated 05/10/2020  showing a 9 mm left proximal ureteral stone, 1000HU, 10cm SSD, clearly seen on KUB today.  Assessment & Plan:   In summary, she is a 29 year old female with a 9 mm left proximal UPJ stone and poorly controlled pain with no clinical or laboratory evidence of infection.  We discussed various treatment options for urolithiasis including observation with or without medical expulsive therapy, shockwave lithotripsy (SWL), ureteroscopy and laser lithotripsy with stent placement, and percutaneous nephrolithotomy.  We discussed that management is based on stone size, location, density, patient co-morbidities, and patient preference.   Stones <34mm in size have a >80% spontaneous passage rate. Data surrounding the use of tamsulosin for medical expulsive therapy is controversial, but meta analyses suggests it is most efficacious for distal stones between 5-72mm in size. Possible side effects include dizziness/lightheadedness, and retrograde ejaculation.  SWL has a lower stone free rate in a single procedure, but also a lower complication rate compared to ureteroscopy and avoids a stent and associated stent related symptoms. Possible complications include renal hematoma, steinstrasse, and need for additional treatment.  Ureteroscopy with laser lithotripsy and stent placement has a higher stone free rate than SWL in a single procedure, however increased complication rate including possible infection, ureteral injury, bleeding, and stent related morbidity. Common stent related symptoms include dysuria, urgency/frequency, and flank pain.  Left shockwave lithotripsy tomorrow Consider 24-hour urine evaluation for metabolic work-up with recurrent stones at young age  9m, MD 05/11/2020  [facility]  [address], [place], [place] [address] [phone]

## 2020-05-11 NOTE — Progress Notes (Signed)
 05/11/20 5:27 PM   Lis D Kimes 06/16/1991 [id number]  CC: Left flank pain, nausea vomiting  HPI: I saw Ms. Barbara Mendez in urology clinic today for evaluation of left flank pain and nausea vomiting. She was seen in the ED yesterday after having a week of left-sided abdominal pain and nausea and a CT was performed showing a 9 mm left proximal ureteral stone with upstream hydronephrosis. Lab work showed no evidence of UTI, she was given narcotic pain medication and discharged with close urology follow-up. She denies any fevers, chills, or dysuria. She reports 1 prior stone episode which she passed spontaneously. No prior urologic surgeries. She denies any NSAID use in the last week.   PMH: Past Medical History:  Diagnosis Date  . Hypotension   . Kidney stone 06/02/2018    Surgical History: Past Surgical History:  Procedure Laterality Date  . CESAREAN SECTION    . CESAREAN SECTION N/A 06/13/2015   Procedure: CESAREAN SECTION;  Surgeon: Anika Cherry, MD;  Location: [facility] ORS;  Service: Obstetrics;  Laterality: N/A;    Family History: Family History  Problem Relation Age of Onset  . Hypertension Father   . Breast cancer Neg Hx   . Ovarian cancer Neg Hx   . Colon cancer Neg Hx   . Diabetes Neg Hx     Social History:  reports that she has been smoking cigars. She has been smoking about 0.25 packs per day. She has never used smokeless tobacco. She reports current alcohol use. She reports previous drug use. Drug: Marijuana.  Physical Exam: BP 123/82   Pulse 80   Ht 5' 5" (1.651 m)   Wt 167 lb (75.8 kg)   LMP 05/03/2020   BMI 27.79 kg/m    Constitutional: Appears uncomfortable Cardiovascular: No clubbing, cyanosis, or edema. Respiratory: Normal respiratory effort, no increased work of breathing. GI: Abdomen is soft, nontender, nondistended, no abdominal masses  Laboratory Data: Reviewed, see HPI  Pertinent Imaging: I have personally reviewed the CT dated 05/10/2020  showing a 9 mm left proximal ureteral stone, 1000HU, 10cm SSD, clearly seen on KUB today.  Assessment & Plan:   In summary, she is a 29-year-old female with a 9 mm left proximal UPJ stone and poorly controlled pain with no clinical or laboratory evidence of infection.  We discussed various treatment options for urolithiasis including observation with or without medical expulsive therapy, shockwave lithotripsy (SWL), ureteroscopy and laser lithotripsy with stent placement, and percutaneous nephrolithotomy.  We discussed that management is based on stone size, location, density, patient co-morbidities, and patient preference.   Stones <5mm in size have a >80% spontaneous passage rate. Data surrounding the use of tamsulosin for medical expulsive therapy is controversial, but meta analyses suggests it is most efficacious for distal stones between 5-10mm in size. Possible side effects include dizziness/lightheadedness, and retrograde ejaculation.  SWL has a lower stone free rate in a single procedure, but also a lower complication rate compared to ureteroscopy and avoids a stent and associated stent related symptoms. Possible complications include renal hematoma, steinstrasse, and need for additional treatment.  Ureteroscopy with laser lithotripsy and stent placement has a higher stone free rate than SWL in a single procedure, however increased complication rate including possible infection, ureteral injury, bleeding, and stent related morbidity. Common stent related symptoms include dysuria, urgency/frequency, and flank pain.  Left shockwave lithotripsy tomorrow Consider 24-hour urine evaluation for metabolic work-up with recurrent stones at young age  Henrietta Cieslewicz, MD 05/11/2020  [facility]   [address], [place], [place] [address] [phone]

## 2020-05-12 ENCOUNTER — Encounter
Admission: RE | Disposition: A | Discharge status: Home / Self Care | Payer: Self-pay | Source: Home / Self Care | Attending: Urology

## 2020-05-12 ENCOUNTER — Ambulatory Visit: Payer: Self-pay

## 2020-05-12 ENCOUNTER — Ambulatory Visit
Admission: RE | Admit: 2020-05-12 | Discharge: 2020-05-12 | Disposition: A | Discharge status: Home / Self Care | Payer: Self-pay | Attending: Urology | Admitting: Urology

## 2020-05-12 ENCOUNTER — Encounter: Payer: Self-pay | Admitting: Urology

## 2020-05-12 DIAGNOSIS — N132 Hydronephrosis with renal and ureteral calculous obstruction: Secondary | ICD-10-CM | POA: Insufficient documentation

## 2020-05-12 DIAGNOSIS — N135 Crossing vessel and stricture of ureter without hydronephrosis: Secondary | ICD-10-CM

## 2020-05-12 DIAGNOSIS — F17290 Nicotine dependence, other tobacco product, uncomplicated: Secondary | ICD-10-CM | POA: Insufficient documentation

## 2020-05-12 DIAGNOSIS — E669 Obesity, unspecified: Secondary | ICD-10-CM | POA: Insufficient documentation

## 2020-05-12 HISTORY — PX: EXTRACORPOREAL SHOCK WAVE LITHOTRIPSY: SHX1557

## 2020-05-12 LAB — POCT PREGNANCY, URINE: Preg Test, Ur: NEGATIVE

## 2020-05-12 SURGERY — LITHOTRIPSY, ESWL
Anesthesia: Choice | Laterality: Left

## 2020-05-12 MED ORDER — DIPHENHYDRAMINE HCL 25 MG PO CAPS
ORAL_CAPSULE | ORAL | Status: AC
  Administered 2020-05-12: 25 mg via ORAL
  Filled 2020-05-12: qty 1

## 2020-05-12 MED ORDER — CIPROFLOXACIN HCL 500 MG PO TABS
ORAL_TABLET | ORAL | Status: AC
  Filled 2020-05-12: qty 1

## 2020-05-12 MED ORDER — ONDANSETRON HCL 4 MG/2ML IJ SOLN
INTRAMUSCULAR | Status: AC
  Filled 2020-05-12: qty 2

## 2020-05-12 MED ORDER — DIAZEPAM 5 MG PO TABS: 10.0000 mg | ORAL_TABLET | ORAL | Status: AC

## 2020-05-12 MED ORDER — CIPROFLOXACIN HCL 500 MG PO TABS: 500.0000 mg | ORAL_TABLET | ORAL | Status: DC

## 2020-05-12 MED ORDER — DIAZEPAM 5 MG PO TABS
ORAL_TABLET | ORAL | Status: AC
  Administered 2020-05-12: 10 mg via ORAL
  Filled 2020-05-12: qty 2

## 2020-05-12 MED ORDER — CIPROFLOXACIN IN D5W 400 MG/200ML IV SOLN
INTRAVENOUS | Status: AC
  Filled 2020-05-12: qty 200

## 2020-05-12 MED ORDER — DIPHENHYDRAMINE HCL 25 MG PO CAPS: 25.0000 mg | ORAL_CAPSULE | ORAL | Status: AC

## 2020-05-12 MED ORDER — CIPROFLOXACIN IN D5W 400 MG/200ML IV SOLN
400.0000 mg | Freq: Once | INTRAVENOUS | Status: AC
  Administered 2020-05-12: 400 mg via INTRAVENOUS

## 2020-05-12 MED ORDER — SODIUM CHLORIDE 0.9 % IV SOLN: INTRAVENOUS | Status: DC

## 2020-05-12 MED ORDER — TAMSULOSIN HCL 0.4 MG PO CAPS: 0.4000 mg | ORAL_CAPSULE | Freq: Every day | ORAL | 0 refills | Status: DC

## 2020-05-12 MED ORDER — ONDANSETRON HCL 4 MG/2ML IJ SOLN
4.0000 mg | Freq: Once | INTRAMUSCULAR | Status: AC
  Administered 2020-05-12: 4 mg via INTRAVENOUS

## 2020-05-12 MED ORDER — OXYCODONE-ACETAMINOPHEN 5-325 MG PO TABS: 1.0000 | ORAL_TABLET | ORAL | 0 refills | Status: AC | PRN

## 2020-05-12 SURGICAL SUPPLY — 33 items
BAG DRAIN CYSTO-URO LG1000N (MISCELLANEOUS) ×2 IMPLANT
BASKET ZERO TIP 1.9FR (BASKET) IMPLANT
BSKT STON RTRVL ZERO TP 1.9FR (BASKET)
BULB IRRIG PATHFIND (MISCELLANEOUS) IMPLANT
CATH URETL 5X70 OPEN END (CATHETERS) ×2 IMPLANT
CNTNR SPEC 2.5X3XGRAD LEK (MISCELLANEOUS)
CONRAY 43 FOR UROLOGY 50M (MISCELLANEOUS) ×2 IMPLANT
CONT SPEC 4OZ STER OR WHT (MISCELLANEOUS)
CONT SPEC 4OZ STRL OR WHT (MISCELLANEOUS)
CONTAINER SPEC 2.5X3XGRAD LEK (MISCELLANEOUS) IMPLANT
DRAPE UTILITY 15X26 TOWEL STRL (DRAPES) ×2 IMPLANT
FIBER LASER TRAC TIP (UROLOGICAL SUPPLIES) IMPLANT
GLOVE BIOGEL PI IND STRL 7.5 (GLOVE) ×1 IMPLANT
GLOVE BIOGEL PI INDICATOR 7.5 (GLOVE) ×1
GOWN STRL REUS W/ TWL LRG LVL3 (GOWN DISPOSABLE) ×1 IMPLANT
GOWN STRL REUS W/ TWL XL LVL3 (GOWN DISPOSABLE) ×1 IMPLANT
GOWN STRL REUS W/TWL LRG LVL3 (GOWN DISPOSABLE) ×2
GOWN STRL REUS W/TWL XL LVL3 (GOWN DISPOSABLE) ×2
GUIDEWIRE GREEN .038 145CM (MISCELLANEOUS) IMPLANT
GUIDEWIRE STR DUAL SENSOR (WIRE) IMPLANT
INFUSOR MANOMETER BAG 3000ML (MISCELLANEOUS) ×2 IMPLANT
INTRODUCER DILATOR DOUBLE (INTRODUCER) IMPLANT
KIT TURNOVER CYSTO (KITS) ×2 IMPLANT
PACK CYSTO AR (MISCELLANEOUS) IMPLANT
SET CYSTO W/LG BORE CLAMP LF (SET/KITS/TRAYS/PACK) ×2 IMPLANT
SHEATH URETERAL 12FRX35CM (MISCELLANEOUS) IMPLANT
SOL .9 NS 3000ML IRR  AL (IV SOLUTION) ×1
SOL .9 NS 3000ML IRR AL (IV SOLUTION) ×1
SOL .9 NS 3000ML IRR UROMATIC (IV SOLUTION) ×1 IMPLANT
SURGILUBE 2OZ TUBE FLIPTOP (MISCELLANEOUS) ×2 IMPLANT
TUBING ART PRESS 48 MALE/FEM (TUBING) IMPLANT
VALVE UROSEAL ADJ ENDO (VALVE) IMPLANT
WATER STERILE IRR 1000ML POUR (IV SOLUTION) ×2 IMPLANT

## 2020-05-12 NOTE — Brief Op Note (Signed)
05/12/2020  9:01 AM  PATIENT:  Barbara Mendez [place]  28 y.o. female  PRE-OPERATIVE DIAGNOSIS:  10mm Left UPJ Stone  POST-OPERATIVE DIAGNOSIS:  Same  PROCEDURE:  Procedure(s): EXTRACORPOREAL SHOCK WAVE LITHOTRIPSY (ESWL) (Left)  SURGEON:  Surgeon(s) and Role:    * Sondra Come, MD - Primary  ANESTHESIA: Conscious Sedation  EBL:  None  Drains: None  Specimen: None  Findings:  1. Excellent fragmentation of stone at conclusion, tolerated SWL well  DISPO: Flomax, pain meds PRN, RTC 2 weeks KUB  Legrand Rams, MD 05/12/2020

## 2020-05-12 NOTE — Progress Notes (Addendum)
   05/12/20 0745  Clinical Encounter Type  Visited With Patient  Visit Type Initial  Referral From Chaplain  Consult/Referral To Chaplain  While standing need patient's room chaplain noticed patient was in a lot of pain, therefore she silently prayed. Chaplain briefly visited with patient. Chaplain noticed that  Patient said her mother in law took her son to school and was coming back to pick her up. Chaplain saw patient after being discharge and staff was wheeling outside. Chaplain wished h er well and told her to take care.

## 2020-05-12 NOTE — Discharge Instructions (Signed)

## 2020-05-14 NOTE — Interval H&P Note (Signed)
History and Physical Interval Note:  05/14/2020 11:46 AM  Barbara Mendez  has presented today for surgery, with the diagnosis of Left UPJ Stone.  The various methods of treatment have been discussed with the patient and family. After consideration of risks, benefits and other options for treatment, the patient has consented to  Procedure(s): EXTRACORPOREAL SHOCK WAVE LITHOTRIPSY (ESWL) (Left) as a surgical intervention.  The patient's history has been reviewed, patient examined, no change in status, stable for surgery.  I have reviewed the patient's chart and labs.  Questions were answered to the patient's satisfaction.     Sondra Come

## 2020-05-15 ENCOUNTER — Encounter: Payer: Self-pay | Admitting: Urology

## 2020-06-02 ENCOUNTER — Ambulatory Visit
Admission: RE | Admit: 2020-06-02 | Discharge: 2020-06-02 | Disposition: A | Discharge status: Home / Self Care | Payer: Self-pay | Attending: Physician Assistant | Admitting: Physician Assistant

## 2020-06-02 ENCOUNTER — Ambulatory Visit (INDEPENDENT_AMBULATORY_CARE_PROVIDER_SITE_OTHER): Payer: Self-pay | Admitting: Physician Assistant

## 2020-06-02 ENCOUNTER — Ambulatory Visit
Admission: RE | Admit: 2020-06-02 | Discharge: 2020-06-02 | Disposition: A | Discharge status: Home / Self Care | Payer: Self-pay | Source: Ambulatory Visit | Attending: Physician Assistant | Admitting: Physician Assistant

## 2020-06-02 ENCOUNTER — Encounter: Payer: Self-pay | Admitting: Physician Assistant

## 2020-06-02 ENCOUNTER — Other Ambulatory Visit: Payer: Self-pay

## 2020-06-02 ENCOUNTER — Other Ambulatory Visit: Payer: Self-pay | Admitting: Physician Assistant

## 2020-06-02 VITALS — BP 122/83 | HR 102 | Ht 65.0 in | Wt 170.0 lb

## 2020-06-02 DIAGNOSIS — N200 Calculus of kidney: Secondary | ICD-10-CM

## 2020-06-02 DIAGNOSIS — N135 Crossing vessel and stricture of ureter without hydronephrosis: Secondary | ICD-10-CM

## 2020-06-02 NOTE — Progress Notes (Signed)
06/02/2020 4:33 PM   Barbara Mendez 12/24/1990 [id number]  CC: Chief Complaint  Patient presents with  . Nephrolithiasis   HPI: Barbara Mendez is a 29 y.o. female with PMH nephrolithiasis now s/p ESWL on 05/12/2020 with Dr. Richardo Hanks for management of a 9 mm left UPJ stone who presents today for routine follow-up.  Today she reports resolution of flank pain. She has passed multiple stone fragments and brings them with her to clinic today for analysis. She passed her last fragment approximately 10 days ago and stopped Flomax around that time. She had difficulty taking this medication due to the size of the capsule.  KUB today reveals resolution of the left UPJ stone.  She does appear to have a solitary residual fragment in the distal left ureter adjacent to stable phleboliths. No additional urolithiasis seen on CT dated 05/10/2020.  She reports poor water intake and primarily drinks sodas including Sprite and Sunkist. She believes her diet may have contributed to recurrent stone formation.  In-office UA today positive for trace-intact blood; urine microscopy with granular casts and amorphous crystals.  PMH: Past Medical History:  Diagnosis Date  . Hypotension   . Kidney stone 06/02/2018    Surgical History: Past Surgical History:  Procedure Laterality Date  . CESAREAN SECTION    . CESAREAN SECTION N/A 06/13/2015   Procedure: CESAREAN SECTION;  Surgeon: Hildred Laser, MD;  Location: [facility] ORS;  Service: Obstetrics;  Laterality: N/A;  . EXTRACORPOREAL SHOCK WAVE LITHOTRIPSY Left 05/12/2020   Procedure: EXTRACORPOREAL SHOCK WAVE LITHOTRIPSY (ESWL);  Surgeon: Sondra Come, MD;  Location: [facility] ORS;  Service: Urology;  Laterality: Left;    Home Medications:  Allergies as of 06/02/2020   No Known Allergies     Medication List       Accurate as of June 02, 2020  4:33 PM. If you have any questions, ask your nurse or doctor.        Norethindrone Acetate-Ethinyl Estradiol  1.5-30 MG-MCG tablet Commonly known as: LOESTRIN Take 1 tablet by mouth daily.   ondansetron 4 MG disintegrating tablet Commonly known as: Zofran ODT Take 1 tablet (4 mg total) by mouth every 8 (eight) hours as needed for nausea or vomiting.   oxyCODONE-acetaminophen 5-325 MG tablet Commonly known as: Percocet Take 1 tablet by mouth every 8 (eight) hours as needed for severe pain.   tamsulosin 0.4 MG Caps capsule Commonly known as: FLOMAX Take 1 capsule (0.4 mg total) by mouth daily after supper.       Allergies:  No Known Allergies  Family History: Family History  Problem Relation Age of Onset  . Hypertension Father   . Breast cancer Neg Hx   . Ovarian cancer Neg Hx   . Colon cancer Neg Hx   . Diabetes Neg Hx     Social History:   reports that she has been smoking cigars. She has been smoking about 0.25 packs per day. She has never used smokeless tobacco. She reports current alcohol use. She reports previous drug use. Drug: Marijuana.  Physical Exam: BP 122/83 (BP Location: Left Arm, Patient Position: Sitting, Cuff Size: Normal)   Pulse (!) 102   Ht 5\' 5"  (1.651 m)   Wt 170 lb (77.1 kg)   LMP 05/03/2020 Comment: Neg Preg Test on 05/12/20  BMI 28.29 kg/m   Constitutional:  Alert and oriented, no acute distress, nontoxic appearing HEENT: [place], AT Cardiovascular: No clubbing, cyanosis, or edema Respiratory: Normal respiratory effort, no increased work of  breathing Skin: No rashes, bruises or suspicious lesions Neurologic: Grossly intact, no focal deficits, moving all 4 extremities Psychiatric: Normal mood and affect  Laboratory Data: Results for orders placed or performed in visit on 06/02/20  Microscopic Examination   Urine  Result Value Ref Range   WBC, UA 0-5 0 - 5 /hpf   RBC 0-2 0 - 2 /hpf   Epithelial Cells (non renal) 0-10 0 - 10 /hpf   Casts Present (A) None seen /lpf   Cast Type Granular casts (A) N/A   Crystals Present (A) N/A   Crystal Type Amorphous  Sediment N/A   Mucus, UA Present (A) Not Estab.   Bacteria, UA None seen None seen/Few  Urinalysis, Complete  Result Value Ref Range   Specific Gravity, UA 1.020 1.005 - 1.030   pH, UA 8.5 (H) 5.0 - 7.5   Color, UA Yellow Yellow   Appearance Ur Cloudy (A) Clear   Leukocytes,UA Negative Negative   Protein,UA Negative Negative/Trace   Glucose, UA Negative Negative   Ketones, UA Negative Negative   RBC, UA Trace (A) Negative   Bilirubin, UA Negative Negative   Urobilinogen, Ur 1.0 0.2 - 1.0 mg/dL   Nitrite, UA Negative Negative   Microscopic Examination See below:    Pertinent Imaging: KUB, 06/02/2020: CLINICAL DATA:  Nephrolithiasis postop  EXAM: ABDOMEN - 1 VIEW  COMPARISON:  05/12/2020, CT 05/10/2020  FINDINGS: Nonobstructed gas pattern. Previously noted calcified stone in the region of left UPJ is not identified. Possible small stone fragments within the left mid pelvis. Other phleboliths are unchanged.  IMPRESSION: Previously noted calcified stone in the region of left UPJ is not identified. Possible small stone fragments in the left mid pelvis.   Electronically Signed   By: Jasmine Pang M.D.   On: 06/02/2020 22:10  I personally reviewed the images referenced above and note clearance of the 9 mm left UVJ stone with an apparent residual fragment at the distal left ureter.  Assessment & Plan:   1. Ureteropelvic junction (UPJ) obstruction, left Resolved on KUB today, though she does appear to have a solitary residual fragment in the distal left ureter. She had difficulty taking Flomax due to the size of the capsule. Okay to continue off Flomax.  We reviewed the general stone prevention recommendations today including increased fluid intake, decrease oxalate intake, moderate calcium intake, and low sodium intake. We discussed the benefit of citric acid for stone dissolution/slow formation. Written resources provided today.  I offered the patient a metabolic  work-up today for recurrent nephrolithiasis. She wishes to defer this at this time. Additionally, she wishes to follow-up on an as-needed basis.  - Urinalysis, Complete  Return if symptoms worsen or fail to improve.  Carman Ching, PA-C  [facility] [address] [place], [place] [address] [phone]

## 2020-06-03 LAB — URINALYSIS, COMPLETE
Bilirubin, UA: NEGATIVE
Glucose, UA: NEGATIVE
Ketones, UA: NEGATIVE
Leukocytes,UA: NEGATIVE
Nitrite, UA: NEGATIVE
Protein,UA: NEGATIVE
Specific Gravity, UA: 1.02 (ref 1.005–1.030)
Urobilinogen, Ur: 1 mg/dL (ref 0.2–1.0)
pH, UA: 8.5 — ABNORMAL HIGH (ref 5.0–7.5)

## 2020-06-03 LAB — MICROSCOPIC EXAMINATION: Bacteria, UA: NONE SEEN

## 2020-06-10 LAB — CALCULI, WITH PHOTOGRAPH (CLINICAL LAB)
Calcium Oxalate Monohydrate: 10 %
Hydroxyapatite: 90 %
Weight Calculi: 95 mg

## 2020-06-21 ENCOUNTER — Other Ambulatory Visit: Payer: Self-pay | Admitting: Obstetrics and Gynecology

## 2020-07-08 ENCOUNTER — Ambulatory Visit: Payer: Self-pay

## 2020-07-08 NOTE — Telephone Encounter (Signed)
Pt. Reports she started having left side abdominal pain this morning."Right where I had surgery for kidney stone." Called surgeon's office, no answer. Instructed to go to UC to be seen. Verbalizes understanding.  Reason for Disposition . [1] MILD-MODERATE pain AND [2] constant AND [3] present > 2 hours  Answer Assessment - Initial Assessment Questions 1. LOCATION: "Where does it hurt?"      Left side 2. RADIATION: "Does the pain shoot anywhere else?" (e.g., chest, back)     No 3. ONSET: "When did the pain begin?" (e.g., minutes, hours or days ago)      Today 4. SUDDEN: "Gradual or sudden onset?"     Sudden 5. PATTERN "Does the pain come and go, or is it constant?"    - If constant: "Is it getting better, staying the same, or worsening?"      (Note: Constant means the pain never goes away completely; most serious pain is constant and it progresses)     - If intermittent: "How long does it last?" "Do you have pain now?"     (Note: Intermittent means the pain goes away completely between bouts)     Comes and goes 6. SEVERITY: "How bad is the pain?"  (e.g., Scale 1-10; mild, moderate, or severe)   - MILD (1-3): doesn't interfere with normal activities, abdomen soft and not tender to touch    - MODERATE (4-7): interferes with normal activities or awakens from sleep, tender to touch    - SEVERE (8-10): excruciating pain, doubled over, unable to do any normal activities      6 7. RECURRENT SYMPTOM: "Have you ever had this type of stomach pain before?" If Yes, ask: "When was the last time?" and "What happened that time?"      Yes 8. CAUSE: "What do you think is causing the stomach pain?"     Unsure 9. RELIEVING/AGGRAVATING FACTORS: "What makes it better or worse?" (e.g., movement, antacids, bowel movement)     No 10. OTHER SYMPTOMS: "Has there been any vomiting, diarrhea, constipation, or urine problems?"       Nausea, diarrhea 11. PREGNANCY: "Is there any chance you are pregnant?" "When was  your last menstrual period?"       No  Protocols used: ABDOMINAL PAIN - [facility]

## 2020-07-25 NOTE — Progress Notes (Signed)
Pt present for annual exam. Pt stated that she was doing well. Pap smear up to date. Pt declined flu vaccine and have not had covid vaccine. Pt satted that she was doing well no problems.

## 2020-07-25 NOTE — Patient Instructions (Addendum)
Preventive Care 21-31 Years Old, Female Preventive care refers to visits with your health care provider and lifestyle choices that can promote health and wellness. This includes:  A yearly physical exam. This may also be called an annual well check.  Regular dental visits and eye exams.  Immunizations.  Screening for certain conditions.  Healthy lifestyle choices, such as eating a healthy diet, getting regular exercise, not using drugs or products that contain nicotine and tobacco, and limiting alcohol use. What can I expect for my preventive care visit? Physical exam Your health care provider will check your:  Height and weight. This may be used to calculate body mass index (BMI), which tells if you are at a healthy weight.  Heart rate and blood pressure.  Skin for abnormal spots. Counseling Your health care provider may ask you questions about your:  Alcohol, tobacco, and drug use.  Emotional well-being.  Home and relationship well-being.  Sexual activity.  Eating habits.  Work and work environment.  Method of birth control.  Menstrual cycle.  Pregnancy history. What immunizations do I need?  Influenza (flu) vaccine  This is recommended every year. Tetanus, diphtheria, and pertussis (Tdap) vaccine  You may need a Td booster every 10 years. Varicella (chickenpox) vaccine  You may need this if you have not been vaccinated. Human papillomavirus (HPV) vaccine  If recommended by your health care provider, you may need three doses over 6 months. Measles, mumps, and rubella (MMR) vaccine  You may need at least one dose of MMR. You may also need a second dose. Meningococcal conjugate (MenACWY) vaccine  One dose is recommended if you are age 19-21 years and a first-year college student living in a residence hall, or if you have one of several medical conditions. You may also need additional booster doses. Pneumococcal conjugate (PCV13) vaccine  You may need  this if you have certain conditions and were not previously vaccinated. Pneumococcal polysaccharide (PPSV23) vaccine  You may need one or two doses if you smoke cigarettes or if you have certain conditions. Hepatitis A vaccine  You may need this if you have certain conditions or if you travel or work in places where you may be exposed to hepatitis A. Hepatitis B vaccine  You may need this if you have certain conditions or if you travel or work in places where you may be exposed to hepatitis B. Haemophilus influenzae type b (Hib) vaccine  You may need this if you have certain conditions. You may receive vaccines as individual doses or as more than one vaccine together in one shot (combination vaccines). Talk with your health care provider about the risks and benefits of combination vaccines. What tests do I need?  Blood tests  Lipid and cholesterol levels. These may be checked every 5 years starting at age 20.  Hepatitis C test.  Hepatitis B test. Screening  Diabetes screening. This is done by checking your blood sugar (glucose) after you have not eaten for a while (fasting).  Sexually transmitted disease (STD) testing.  BRCA-related cancer screening. This may be done if you have a family history of breast, ovarian, tubal, or peritoneal cancers.  Pelvic exam and Pap test. This may be done every 3 years starting at age 21. Starting at age 30, this may be done every 5 years if you have a Pap test in combination with an HPV test. Talk with your health care provider about your test results, treatment options, and if necessary, the need for more tests.   Follow these instructions at home: Eating and drinking   Eat a diet that includes fresh fruits and vegetables, whole grains, lean protein, and low-fat dairy.  Take vitamin and mineral supplements as recommended by your health care provider.  Do not drink alcohol if: ? Your health care provider tells you not to drink. ? You are  pregnant, may be pregnant, or are planning to become pregnant.  If you drink alcohol: ? Limit how much you have to 0-1 drink a day. ? Be aware of how much alcohol is in your drink. In the [place] one drink equals one 12 oz bottle of beer (355 mL), one 5 oz glass of wine (148 mL), or one 1 oz glass of hard liquor (44 mL). Lifestyle  Take daily care of your teeth and gums.  Stay active. Exercise for at least 30 minutes on 5 or more days each week.  Do not use any products that contain nicotine or tobacco, such as cigarettes, e-cigarettes, and chewing tobacco. If you need help quitting, ask your health care provider.  If you are sexually active, practice safe sex. Use a condom or other form of birth control (contraception) in order to prevent pregnancy and STIs (sexually transmitted infections). If you plan to become pregnant, see your health care provider for a preconception visit. What's next?  Visit your health care provider once a year for a well check visit.  Ask your health care provider how often you should have your eyes and teeth checked.  Stay up to date on all vaccines. This information is not intended to replace advice given to you by your health care provider. Make sure you discuss any questions you have with your health care provider. Document Revised: 05/22/2018 Document Reviewed: 05/22/2018 Elsevier Patient Education  2020 [employer] Breast Self-Awareness Breast self-awareness is knowing how your breasts look and feel. Doing breast self-awareness is important. It allows you to catch a breast problem early while it is still small and can be treated. All women should do breast self-awareness, including women who have had breast implants. Tell your doctor if you notice a change in your breasts. What you need:  A mirror.  A well-lit room. How to do a breast self-exam A breast self-exam is one way to learn what is normal for your breasts and to check for changes. To do a  breast self-exam: Look for changes  1. Take off all the clothes above your waist. 2. Stand in front of a mirror in a room with good lighting. 3. Put your hands on your hips. 4. Push your hands down. 5. Look at your breasts and nipples in the mirror to see if one breast or nipple looks different from the other. Check to see if: ? The shape of one breast is different. ? The size of one breast is different. ? There are wrinkles, dips, and bumps in one breast and not the other. 6. Look at each breast for changes in the skin, such as: ? Redness. ? Scaly areas. 7. Look for changes in your nipples, such as: ? Liquid around the nipples. ? Bleeding. ? Dimpling. ? Redness. ? A change in where the nipples are. Feel for changes  1. Lie on your back on the floor. 2. Feel each breast. To do this, follow these steps: ? Pick a breast to feel. ? Put the arm closest to that breast above your head. ? Use your other arm to feel the nipple area of your breast. Feel   the area with the pads of your three middle fingers by making small circles with your fingers. For the first circle, press lightly. For the second circle, press harder. For the third circle, press even harder. ? Keep making circles with your fingers at the different pressures as you move down your breast. Stop when you feel your ribs. ? Move your fingers a little toward the center of your body. ? Start making circles with your fingers again, this time going up until you reach your collarbone. ? Keep making up-and-down circles until you reach your armpit. Remember to keep using the three pressures. ? Feel the other breast in the same way. 3. Sit or stand in the tub or shower. 4. With soapy water on your skin, feel each breast the same way you did in step 2 when you were lying on the floor. Write down what you find Writing down what you find can help you remember what to tell your doctor. Write down:  What is normal for each breast.  Any  changes you find in each breast, including: ? The kind of changes you find. ? Whether you have pain. ? Size and location of any lumps.  When you last had your menstrual period. General tips  Check your breasts every month.  If you are breastfeeding, the best time to check your breasts is after you feed your baby or after you use a breast pump.  If you get menstrual periods, the best time to check your breasts is 5-7 days after your menstrual period is over.  With time, you will become comfortable with the self-exam, and you will begin to know if there are changes in your breasts. Contact a doctor if you:  See a change in the shape or size of your breasts or nipples.  See a change in the skin of your breast or nipples, such as red or scaly skin.  Have fluid coming from your nipples that is not normal.  Find a lump or thick area that was not there before.  Have pain in your breasts.  Have any concerns about your breast health. Summary  Breast self-awareness includes looking for changes in your breasts, as well as feeling for changes within your breasts.  Breast self-awareness should be done in front of a mirror in a well-lit room.  You should check your breasts every month. If you get menstrual periods, the best time to check your breasts is 5-7 days after your menstrual period is over.  Let your doctor know of any changes you see in your breasts, including changes in size, changes on the skin, pain or tenderness, or fluid from your nipples that is not normal. This information is not intended to replace advice given to you by your health care provider. Make sure you discuss any questions you have with your health care provider. Document Revised: 04/29/2018 Document Reviewed: 04/29/2018 Elsevier Patient Education  [place] 86-58 Years Old, Female Preventive care refers to visits with your health care provider and lifestyle choices that can promote  health and wellness. This includes:  A yearly physical exam. This may also be called an annual well check.  Regular dental visits and eye exams.  Immunizations.  Screening for certain conditions.  Healthy lifestyle choices, such as eating a healthy diet, getting regular exercise, not using drugs or products that contain nicotine and tobacco, and limiting alcohol use. What can I expect for my preventive care visit? Physical exam Your  health care provider will check your:  Height and weight. This may be used to calculate body mass index (BMI), which tells if you are at a healthy weight.  Heart rate and blood pressure.  Skin for abnormal spots. Counseling Your health care provider may ask you questions about your:  Alcohol, tobacco, and drug use.  Emotional well-being.  Home and relationship well-being.  Sexual activity.  Eating habits.  Work and work Statistician.  Method of birth control.  Menstrual cycle.  Pregnancy history. What immunizations do I need?  Influenza (flu) vaccine  This is recommended every year. Tetanus, diphtheria, and pertussis (Tdap) vaccine  You may need a Td booster every 10 years. Varicella (chickenpox) vaccine  You may need this if you have not been vaccinated. Human papillomavirus (HPV) vaccine  If recommended by your health care provider, you may need three doses over 6 months. Measles, mumps, and rubella (MMR) vaccine  You may need at least one dose of MMR. You may also need a second dose. Meningococcal conjugate (MenACWY) vaccine  One dose is recommended if you are age 80-21 years and a first-year college student living in a residence hall, or if you have one of several medical conditions. You may also need additional booster doses. Pneumococcal conjugate (PCV13) vaccine  You may need this if you have certain conditions and were not previously vaccinated. Pneumococcal polysaccharide (PPSV23) vaccine  You may need one or two  doses if you smoke cigarettes or if you have certain conditions. Hepatitis A vaccine  You may need this if you have certain conditions or if you travel or work in places where you may be exposed to hepatitis A. Hepatitis B vaccine  You may need this if you have certain conditions or if you travel or work in places where you may be exposed to hepatitis B. Haemophilus influenzae type b (Hib) vaccine  You may need this if you have certain conditions. You may receive vaccines as individual doses or as more than one vaccine together in one shot (combination vaccines). Talk with your health care provider about the risks and benefits of combination vaccines. What tests do I need?  Blood tests  Lipid and cholesterol levels. These may be checked every 5 years starting at age 53.  Hepatitis C test.  Hepatitis B test. Screening  Diabetes screening. This is done by checking your blood sugar (glucose) after you have not eaten for a while (fasting).  Sexually transmitted disease (STD) testing.  BRCA-related cancer screening. This may be done if you have a family history of breast, ovarian, tubal, or peritoneal cancers.  Pelvic exam and Pap test. This may be done every 3 years starting at age 57. Starting at age 52, this may be done every 5 years if you have a Pap test in combination with an HPV test. Talk with your health care provider about your test results, treatment options, and if necessary, the need for more tests. Follow these instructions at home: Eating and drinking   Eat a diet that includes fresh fruits and vegetables, whole grains, lean protein, and low-fat dairy.  Take vitamin and mineral supplements as recommended by your health care provider.  Do not drink alcohol if: ? Your health care provider tells you not to drink. ? You are pregnant, may be pregnant, or are planning to become pregnant.  If you drink alcohol: ? Limit how much you have to 0-1 drink a day. ? Be aware of  how much alcohol is in  your drink. In the [place] one drink equals one 12 oz bottle of beer (355 mL), one 5 oz glass of wine (148 mL), or one 1 oz glass of hard liquor (44 mL). Lifestyle  Take daily care of your teeth and gums.  Stay active. Exercise for at least 30 minutes on 5 or more days each week.  Do not use any products that contain nicotine or tobacco, such as cigarettes, e-cigarettes, and chewing tobacco. If you need help quitting, ask your health care provider.  If you are sexually active, practice safe sex. Use a condom or other form of birth control (contraception) in order to prevent pregnancy and STIs (sexually transmitted infections). If you plan to become pregnant, see your health care provider for a preconception visit. What's next?  Visit your health care provider once a year for a well check visit.  Ask your health care provider how often you should have your eyes and teeth checked.  Stay up to date on all vaccines. This information is not intended to replace advice given to you by your health care provider. Make sure you discuss any questions you have with your health care provider. Document Revised: 05/22/2018 Document Reviewed: 05/22/2018 Elsevier Patient Education  2020 [employer].

## 2020-07-26 ENCOUNTER — Other Ambulatory Visit: Payer: Self-pay

## 2020-07-26 ENCOUNTER — Ambulatory Visit: Payer: Self-pay | Admitting: Obstetrics and Gynecology

## 2020-07-26 ENCOUNTER — Encounter: Payer: Self-pay | Admitting: Obstetrics and Gynecology

## 2020-07-26 VITALS — BP 113/82 | HR 91 | Ht 65.0 in | Wt 167.6 lb

## 2020-07-26 DIAGNOSIS — Z3041 Encounter for surveillance of contraceptive pills: Secondary | ICD-10-CM

## 2020-07-26 DIAGNOSIS — Z01419 Encounter for gynecological examination (general) (routine) without abnormal findings: Secondary | ICD-10-CM

## 2020-07-26 DIAGNOSIS — Z87442 Personal history of urinary calculi: Secondary | ICD-10-CM

## 2020-07-26 DIAGNOSIS — E663 Overweight: Secondary | ICD-10-CM

## 2020-07-26 MED ORDER — NORETHIN ACE-ETH ESTRAD-FE 1.5-30 MG-MCG PO TABS
1.0000 | ORAL_TABLET | Freq: Every day | ORAL | 3 refills | Status: DC
Start: 2020-07-26 — End: 2021-06-30

## 2020-07-26 NOTE — Progress Notes (Signed)
GYNECOLOGY ANNUAL PHYSICAL EXAM PROGRESS NOTE  Subjective:    Barbara Mendez [place] is a 29 y.o. [phone] female who presents for an annual exam. The patient is sexually active. The patient wears seatbelts: yes. The patient participates in regular exercise: no. Has the patient ever been transfused or tattooed?: not asked. The patient reports that there is not domestic violence in her life.   The patient has the following complaints today. 1.  Reports having a kidney stone recently (thinks September), required surgery for management. Notes this this the second stone she has had in 1 year.    Menstrual History: Menarche age: 73 Patient's last menstrual period was 07/24/2020. Period Duration (Days): 4-5 Period Pattern: Regular Menstrual Flow: Heavy Menstrual Control: Maxi pad Menstrual Control Change Freq (Hours): 3-4 Dysmenorrhea: (!) Severe Dysmenorrhea Symptoms: Cramping   Gynecologic History Contraception: OCPs History of STI's: Denies Last Pap:  07/01/2018. Results were: normal.  Denies h/o abnormal pap smears.    Upstream - 07/26/20 0816      Pregnancy Intention Screening   Does the patient want to become pregnant in the next year? No    Does the patient's partner want to become pregnant in the next year? No    Would the patient like to discuss contraceptive options today? No      Contraception Wrap Up   Current Method Oral Contraceptive           Upstream - 07/26/20 0816      Pregnancy Intention Screening   Does the patient want to become pregnant in the next year? No    Does the patient's partner want to become pregnant in the next year? No    Would the patient like to discuss contraceptive options today? No      Contraception Wrap Up   Current Method Oral Contraceptive          The pregnancy intention screening data noted above was reviewed. Potential methods of contraception were discussed. The patient elected to proceed with Oral Contraceptive.    OB  History  Gravida Para Term Preterm AB Living  3 2 2  0 1 2  SAB TAB Ectopic Multiple Live Births  1 0 0 0 2    # Outcome Date GA Lbr Len/2nd Weight Sex Delivery Anes PTL Lv  3 Term 06/13/15 [redacted]w[redacted]d  5 lb 15.6 oz (2.71 kg) F CS-LTranv Spinal  LIV     Name: CAMPBELL,GIRL Rian     Apgar1: 8  Apgar5: 9  2 SAB 08/24/14     SAB   FD  1 Term 2010   8 lb (3.629 kg) F CS-LTranv  N LIV    Past Medical History:  Diagnosis Date  . Hypotension   . Kidney stone 06/02/2018    Past Surgical History:  Procedure Laterality Date  . CESAREAN SECTION    . CESAREAN SECTION N/A 06/13/2015   Procedure: CESAREAN SECTION;  Surgeon: 06/15/2015, MD;  Location: [facility] ORS;  Service: Obstetrics;  Laterality: N/A;  . EXTRACORPOREAL SHOCK WAVE LITHOTRIPSY Left 05/12/2020   Procedure: EXTRACORPOREAL SHOCK WAVE LITHOTRIPSY (ESWL);  Surgeon: 05/14/2020, MD;  Location: [facility] ORS;  Service: Urology;  Laterality: Left;  . KIDNEY STONE SURGERY      Family History  Problem Relation Age of Onset  . Hypertension Father   . Breast cancer Neg Hx   . Ovarian cancer Neg Hx   . Colon cancer Neg Hx   . Diabetes Neg Hx  Social History   Socioeconomic History  . Marital status: Married    Spouse name: Not on file  . Number of children: Not on file  . Years of education: Not on file  . Highest education level: Not on file  Occupational History  . Not on file  Tobacco Use  . Smoking status: Current Every Day Smoker    Packs/day: 0.25    Types: Cigars    Last attempt to quit: 09/24/2014    Years since quitting: 5.8  . Smokeless tobacco: Never Used  Vaping Use  . Vaping Use: Never used  Substance and Sexual Activity  . Alcohol use: Yes    Comment: occass  . Drug use: Yes    Types: Marijuana  . Sexual activity: Yes    Birth control/protection: None, Pill  Other Topics Concern  . Not on file  Social History Narrative  . Not on file   Social Determinants of Health   Financial Resource Strain:   .  Difficulty of Paying Living Expenses: Not on file  Food Insecurity:   . Worried About Programme researcher, broadcasting/film/video in the Last Year: Not on file  . Ran Out of Food in the Last Year: Not on file  Transportation Needs:   . Lack of Transportation (Medical): Not on file  . Lack of Transportation (Non-Medical): Not on file  Physical Activity:   . Days of Exercise per Week: Not on file  . Minutes of Exercise per Session: Not on file  Stress:   . Feeling of Stress : Not on file  Social Connections:   . Frequency of Communication with Friends and Family: Not on file  . Frequency of Social Gatherings with Friends and Family: Not on file  . Attends Religious Services: Not on file  . Active Member of Clubs or Organizations: Not on file  . Attends Banker Meetings: Not on file  . Marital Status: Not on file  Intimate Partner Violence:   . Fear of Current or Ex-Partner: Not on file  . Emotionally Abused: Not on file  . Physically Abused: Not on file  . Sexually Abused: Not on file    Current Outpatient Medications on File Prior to Visit  Medication Sig Dispense Refill  . LARIN FE 1.5/30 1.5-30 MG-MCG tablet Take 1 tablet by mouth once daily 84 tablet 0   Current Facility-Administered Medications on File Prior to Visit  Medication Dose Route Frequency Provider Last Rate Last Admin  . ondansetron (ZOFRAN) injection 4 mg  4 mg Intravenous Once Sondra Come, MD        No Known Allergies  Review of Systems Constitutional: negative for chills, fatigue, fevers and sweats Eyes: negative for irritation, redness and visual disturbance Ears, nose, mouth, throat, and face: negative for hearing loss, nasal congestion, snoring and tinnitus Respiratory: negative for asthma, cough, sputum Cardiovascular: negative for chest pain, dyspnea, exertional chest pressure/discomfort, irregular heart beat, palpitations and syncope Gastrointestinal: negative for abdominal pain, change in bowel habits,  nausea and vomiting Genitourinary:  No genital lesions, sexual problems and vaginal discharge, pelvic pain, dysuria and urinary incontinence. Integument/breast: negative for breast lump, breast tenderness and nipple discharge.  Hematologic/lymphatic: negative for bleeding and easy bruising Musculoskeletal:negative for back pain and muscle weakness Neurological: negative for dizziness, headaches, vertigo and weakness Endocrine: negative for diabetic symptoms including polydipsia, polyuria and skin dryness Allergic/Immunologic: negative for hay fever and urticaria   Psychological: negative for anxiety, depression, concentration difficulties, mood swings or suicidal  ideation.  Dermatological ROS: Negative for eczema and skin lesion changes     Objective:  Blood pressure 113/82, pulse 91, height 5\' 5"  (1.651 m), weight 167 lb 9.6 oz (76 kg), last menstrual period 07/24/2020. Body mass index is 27.89 kg/m.     General Appearance:    Alert, cooperative, no distress, appears stated age, overweight  Head:    Normocephalic, without obvious abnormality, atraumatic  Eyes:    PERRL, conjunctiva/corneas clear, EOM's intact, both eyes  Ears:    Normal external ear canals, both ears  Nose:   Nares normal, septum midline, mucosa normal, no drainage or sinus tenderness  Throat:   Lips, mucosa, and tongue normal; teeth and gums normal  Neck:   Supple, symmetrical, trachea midline, no adenopathy; thyroid: no enlargement/tenderness/nodules; no carotid bruit or JVD  Back:     Symmetric, no curvature, ROM normal, no CVA tenderness  Lungs:     Clear to auscultation bilaterally, respirations unlabored  Chest Wall:    No tenderness or deformity   Heart:    Regular rate and rhythm, S1 and S2 normal, no murmur, rub or gallop  Breast Exam:    No tenderness, masses, or nipple abnormality  Abdomen:     Soft, non-tender, bowel sounds active all four quadrants, no masses, no organomegaly.    Genitalia:     Pelvic:external genitalia normal, vagina without lesions, discharge, or tenderness.  Dark red blood in vaginal vault.  Rectovaginal septum  normal. Cervix normal in appearance, no cervical motion tenderness, no adnexal masses or tenderness.  IUD strings visible, ~ 3 cm in length. Uterus normal size, shape, mobile, regular contours, nontender.  Rectal:    Normal external sphincter.  No hemorrhoids appreciated. Internal exam not done.   Extremities:   Extremities normal, atraumatic, no cyanosis or edema  Pulses:   2+ and symmetric all extremities  Skin:   Skin color, texture, turgor normal, or lesions.     Lymph nodes:   Cervical, supraclavicular, and axillary nodes normal  Neurologic:   CNII-XII intact, normal strength, sensation and reflexes throughout  Psychologic:   Normal thought process, speech.     .  Labs:  Lab Results  Component Value Date   WBC 10.6 (H) 05/10/2020   HGB 13.2 05/10/2020   HCT 39.0 05/10/2020   MCV 91.1 05/10/2020   PLT 320 05/10/2020    Lab Results  Component Value Date   CREATININE 0.67 05/10/2020   BUN 9 05/10/2020   NA 136 05/10/2020   K 3.8 05/10/2020   CL 105 05/10/2020   CO2 24 05/10/2020    Lab Results  Component Value Date   ALT 12 06/02/2018   AST 13 (L) 06/02/2018   ALKPHOS 50 06/02/2018   BILITOT 0.6 06/02/2018    No results found for: TSH   Assessment:   Healthy female exam. Overweight Contraception management H/o kidney stones  Plan:    - Blood tests: None ordered. Up to date.  - Breast self exam technique reviewed and patient encouraged to perform self-exam monthly. - Contraception: OCPs. Refill given for Junel - Discussed healthy lifestyle modifications. - Pap smear up to date.     - Declined flu vaccine.  - Declined for COVID vaccination.  - Follow up in 1 year for annual exam.     08/02/2018, MD [facility]

## 2021-05-19 ENCOUNTER — Encounter: Payer: Self-pay | Admitting: Obstetrics and Gynecology

## 2021-06-09 ENCOUNTER — Emergency Department
Admission: EM | Admit: 2021-06-09 | Discharge: 2021-06-09 | Disposition: A | Discharge status: Home / Self Care | Payer: Self-pay | Attending: Emergency Medicine | Admitting: Emergency Medicine

## 2021-06-09 ENCOUNTER — Ambulatory Visit: Payer: Self-pay

## 2021-06-09 ENCOUNTER — Emergency Department: Payer: Self-pay

## 2021-06-09 ENCOUNTER — Other Ambulatory Visit: Payer: Self-pay

## 2021-06-09 DIAGNOSIS — F17210 Nicotine dependence, cigarettes, uncomplicated: Secondary | ICD-10-CM | POA: Insufficient documentation

## 2021-06-09 DIAGNOSIS — Z28310 Unvaccinated for covid-19: Secondary | ICD-10-CM | POA: Insufficient documentation

## 2021-06-09 DIAGNOSIS — J069 Acute upper respiratory infection, unspecified: Secondary | ICD-10-CM | POA: Insufficient documentation

## 2021-06-09 DIAGNOSIS — U071 COVID-19: Secondary | ICD-10-CM | POA: Insufficient documentation

## 2021-06-09 LAB — RESP PANEL BY RT-PCR (FLU A&B, COVID) ARPGX2
Influenza A by PCR: NEGATIVE
Influenza B by PCR: NEGATIVE
SARS Coronavirus 2 by RT PCR: POSITIVE — AB

## 2021-06-09 MED ORDER — IPRATROPIUM-ALBUTEROL 0.5-2.5 (3) MG/3ML IN SOLN
3.0000 mL | Freq: Once | RESPIRATORY_TRACT | Status: AC
  Administered 2021-06-09: 3 mL via RESPIRATORY_TRACT
  Filled 2021-06-09: qty 3

## 2021-06-09 NOTE — Discharge Instructions (Addendum)
Bromfed-DM Bromfed-DM no acute findings on chest x-ray.  COVID-19 test was positive.  Read and follow discharge care instructions.  Take medication as directed.  If positive for COVID-19 must quarantine for 10 days.

## 2021-06-09 NOTE — ED Provider Notes (Signed)
[facility] Emergency Department Provider Note   ____________________________________________   Event Date/Time   First MD Initiated Contact with Patient 06/09/21 [phone]     (approximate)  I have reviewed the triage vital signs and the nursing notes.   HISTORY  Chief Complaint URI    HPI Barbara Mendez is a 30 y.o. female patient complain of sinus congestion, chest congestion, and body aches for 3 days.  Patient said her child is just recovered from an upper respiratory infection.  Patient stated decreased appetite with nausea.  Denies vomiting or diarrhea.  Denies recent travel or known contact with COVID-19.  Patient has not taken the vaccine for COVID-19.     Past Medical History:  Diagnosis Date   Hypotension    Kidney stone 06/02/2018    Patient Active Problem List   Diagnosis Date Noted   Overweight (BMI 25.0-29.9) 06/28/2017   Current mild episode of major depressive disorder without prior episode (HCC) 06/28/2017   History of tobacco abuse 03/10/2015    Past Surgical History:  Procedure Laterality Date   CESAREAN SECTION     CESAREAN SECTION N/A 06/13/2015   Procedure: CESAREAN SECTION;  Surgeon: Hildred Laser, MD;  Location: [facility] ORS;  Service: Obstetrics;  Laterality: N/A;   EXTRACORPOREAL SHOCK WAVE LITHOTRIPSY Left 05/12/2020   Procedure: EXTRACORPOREAL SHOCK WAVE LITHOTRIPSY (ESWL);  Surgeon: Sondra Come, MD;  Location: [facility] ORS;  Service: Urology;  Laterality: Left;   KIDNEY STONE SURGERY      Prior to Admission medications   Medication Sig Start Date End Date Taking? Authorizing Provider  norethindrone-ethinyl estradiol-iron (LARIN FE 1.5/30) 1.5-30 MG-MCG tablet Take 1 tablet by mouth daily. 07/26/20   Hildred Laser, MD    Allergies Patient has no known allergies.  Family History  Problem Relation Age of Onset   Hypertension Father    Breast cancer Neg Hx    Ovarian cancer Neg Hx    Colon cancer Neg Hx    Diabetes  Neg Hx     Social History Social History   Tobacco Use   Smoking status: Every Day    Packs/day: 0.25    Types: Cigars, Cigarettes    Last attempt to quit: 09/24/2014    Years since quitting: 6.7   Smokeless tobacco: Never  Vaping Use   Vaping Use: Never used  Substance Use Topics   Alcohol use: Yes    Comment: occass   Drug use: Yes    Types: Marijuana    Review of Systems  Constitutional: No fever/chills Eyes: No visual changes. ENT: No sore throat. Cardiovascular: Denies chest pain. Respiratory: Denies shortness of breath. Gastrointestinal: No abdominal pain.  No nausea, no vomiting.  No diarrhea.  No constipation. Genitourinary: Negative for dysuria. Musculoskeletal: Negative for back pain. Skin: Negative for rash. Neurological: Negative for headaches, focal weakness or numbness. Psychiatric: Depression ____________________   PHYSICAL EXAM:  VITAL SIGNS: ED Triage Vitals  Enc Vitals Group     BP 06/09/21 0913 (!) 144/95     Pulse Rate 06/09/21 0919 95     Resp 06/09/21 0913 16     Temp 06/09/21 0913 98.7 F (37.1 C)     Temp Source 06/09/21 0913 Oral     SpO2 06/09/21 0919 100 %     Weight 06/09/21 0908 155 lb (70.3 kg)     Height 06/09/21 0908 5\' 5"  (1.651 m)     Head Circumference --      Peak Flow --  Pain Score 06/09/21 0908 0     Pain Loc --      Pain Edu? --      Excl. in GC? --     Constitutional: Alert and oriented. Well appearing and in no acute distress. Eyes: Conjunctivae are normal. PERRL. EOMI. Head: Atraumatic. Nose: Bilateral edematous nasal turbinates clear rhinorrhea.   Mouth/Throat: Mucous membranes are moist.  Oropharynx non-erythematous.  Postnasal drainage. Neck: No stridor.   Hematological/Lymphatic/Immunilogical: No cervical lymphadenopathy. Cardiovascular: Normal rate, regular rhythm. Grossly normal heart sounds.  Good peripheral circulation. Respiratory: Normal respiratory effort.  No retractions. Lungs bilateral  rhonchi breath sounds Gastrointestinal: Soft and nontender. No distention. No abdominal bruits. No CVA tenderness. Genitourinary: Deferred Musculoskeletal: No lower extremity tenderness nor edema.  No joint effusions. Neurologic:  Normal speech and language. No gross focal neurologic deficits are appreciated. No gait instability. Skin:  Skin is warm, dry and intact. No rash noted. Psychiatric: Mood and affect are normal. Speech and behavior are normal.  ____________________________________________   LABS (all labs ordered are listed, but only abnormal results are displayed)  Labs Reviewed  RESP PANEL BY RT-PCR (FLU A&B, COVID) ARPGX2 - Abnormal; Notable for the following components:      Result Value   SARS Coronavirus 2 by RT PCR POSITIVE (*)    All other components within normal limits   ____________________________________________  EKG   ____________________________________________  RADIOLOGY I, Joni Reining, personally viewed and evaluated these images (plain radiographs) as part of my medical decision making, as well as reviewing the written report by the radiologist.  ED MD interpretation: No acute findings on chest x-ray.  Official radiology report(s): DG Chest Portable 1 View  Result Date: 06/09/2021 CLINICAL DATA:  Productive cough, abnormal breath sounds EXAM: PORTABLE CHEST 1 VIEW COMPARISON:  None. FINDINGS: The heart size and mediastinal contours are within normal limits. Both lungs are clear. The visualized skeletal structures are unremarkable. IMPRESSION: No active disease. Electronically Signed   By: Charlett Nose M.D.   On: 06/09/2021 09:52    ____________________________________________   PROCEDURES  Procedure(s) performed (including Critical Care):  Procedures   ____________________________________________   INITIAL IMPRESSION / ASSESSMENT AND PLAN / ED COURSE  As part of my medical decision making, I reviewed the following data within the  electronic MEDICAL RECORD NUMBER         Patient presents with cough, sinus congestion and body aches.  Patient test positive for COVID-19.  Patient given discharge care instructions for quarantine.  Advised take medication as directed and follow-up with PCP.      ____________________________________________   FINAL CLINICAL IMPRESSION(S) / ED DIAGNOSES  Final diagnoses:  Viral URI with cough  COVID-19     ED Discharge Orders          Ordered    brompheniramine-pseudoephedrine-DM 30-2-10 MG/5ML syrup  4 times daily PRN        Pending    ibuprofen (ADVIL) 800 MG tablet  Every 8 hours PRN        Pending             Note:  This document was prepared using Dragon voice recognition software and may include unintentional dictation errors.    Joni Reining, PA-C 06/09/21 1135    Jene Every, MD 06/09/21 1158

## 2021-06-09 NOTE — Telephone Encounter (Signed)
[phone] Pt was just seen at the [facility] and was given the impression that something was being called in to her pharmacy for Covid relief. (Positive) Pt is requesting a call back for covid advice    Pt. Calling to report she left ED 1 hour ago and prescriptions are not at her pharmacy yet. Instructed to wait a bit longer and check again. Given [facility] number if prescriptions do not go through.

## 2021-06-09 NOTE — ED Triage Notes (Signed)
Pt c/o cough with sinus and chest congestion for the past 3 days, states son has had similar sx

## 2021-06-15 ENCOUNTER — Telehealth: Payer: Self-pay | Admitting: Certified Nurse Midwife

## 2021-06-15 NOTE — Telephone Encounter (Signed)
Pt called- upset that her physical got pushed out again, states that she scheduled this apt last year and now her physical is pushed into 2023- she "doesn't understand" I attempted to explain, I offered pt to speak with office manger she declined she asked what other providers were available. Pt requested to see annie, appointment updated.

## 2021-06-29 ENCOUNTER — Other Ambulatory Visit: Payer: Self-pay | Admitting: Obstetrics and Gynecology

## 2021-06-30 NOTE — Telephone Encounter (Signed)
Further refills to be given at appointment.

## 2021-07-18 ENCOUNTER — Telehealth: Payer: Self-pay | Admitting: Licensed Clinical Social Worker

## 2021-07-18 NOTE — Telephone Encounter (Signed)
07/12/21 Patient left vm inquiring about counseling services for her child.

## 2021-07-27 ENCOUNTER — Encounter: Payer: Self-pay | Admitting: Obstetrics and Gynecology

## 2021-07-31 ENCOUNTER — Other Ambulatory Visit: Payer: Self-pay

## 2021-07-31 ENCOUNTER — Ambulatory Visit (INDEPENDENT_AMBULATORY_CARE_PROVIDER_SITE_OTHER): Payer: Self-pay | Admitting: Certified Nurse Midwife

## 2021-07-31 ENCOUNTER — Encounter: Payer: Self-pay | Admitting: Certified Nurse Midwife

## 2021-07-31 ENCOUNTER — Other Ambulatory Visit (HOSPITAL_COMMUNITY)
Admission: RE | Admit: 2021-07-31 | Discharge: 2021-07-31 | Disposition: A | Discharge status: Home / Self Care | Payer: Self-pay | Source: Ambulatory Visit | Attending: Obstetrics and Gynecology | Admitting: Obstetrics and Gynecology

## 2021-07-31 VITALS — BP 128/82 | HR 92 | Ht 65.0 in | Wt 172.1 lb

## 2021-07-31 DIAGNOSIS — Z01419 Encounter for gynecological examination (general) (routine) without abnormal findings: Secondary | ICD-10-CM

## 2021-07-31 DIAGNOSIS — Z113 Encounter for screening for infections with a predominantly sexual mode of transmission: Secondary | ICD-10-CM

## 2021-07-31 DIAGNOSIS — Z124 Encounter for screening for malignant neoplasm of cervix: Secondary | ICD-10-CM

## 2021-07-31 DIAGNOSIS — Z13220 Encounter for screening for lipoid disorders: Secondary | ICD-10-CM

## 2021-07-31 NOTE — Progress Notes (Signed)
GYNECOLOGY ANNUAL PREVENTATIVE CARE ENCOUNTER NOTE  History:     Barbara Mendez [place] is a 30 y.o. [phone] female here for a routine annual gynecologic exam.  Current complaints: none.   Denies abnormal vaginal bleeding, discharge, pelvic pain, problems with intercourse or other gynecologic concerns.     Social Relationship: married  Living:spouse , children, mother in law ( 12/6 yr old girls) Work: none currently  Exercise: walks dog daily Smoke/Alcohol/drug use: smokes 5  cigarettes a day x 3 yrs, occasional alcohol, denies drug use.   Gynecologic History Patient's last menstrual period was 07/05/2021 (approximate). Contraception: OCP (estrogen/progesterone) Last Pap: 07/01/2018. Results were: normal  Last mammogram: n/a.    Upstream - 07/31/21 1320       Pregnancy Intention Screening   Does the patient want to become pregnant in the next year? N/A    Does the patient's partner want to become pregnant in the next year? N/A            The pregnancy intention screening data noted above was reviewed. Potential methods of contraception were discussed. The patient elected to proceed with ocp.    Obstetric History OB History  Gravida Para Term Preterm AB Living  3 2 2   1 2   SAB IAB Ectopic Multiple Live Births  1     0 2    # Outcome Date GA Lbr Len/2nd Weight Sex Delivery Anes PTL Lv  3 Term 06/13/15 [redacted]w[redacted]d  5 lb 15.6 oz (2.71 kg) F CS-LTranv Spinal  LIV  2 SAB 08/24/14     SAB   FD  1 Term 2010   8 lb (3.629 kg) F CS-LTranv  N LIV    Past Medical History:  Diagnosis Date   Hypotension    Kidney stone 06/02/2018    Past Surgical History:  Procedure Laterality Date   CESAREAN SECTION     CESAREAN SECTION N/A 06/13/2015   Procedure: CESAREAN SECTION;  Surgeon: 06/15/2015, MD;  Location: [facility] ORS;  Service: Obstetrics;  Laterality: N/A;   EXTRACORPOREAL SHOCK WAVE LITHOTRIPSY Left 05/12/2020   Procedure: EXTRACORPOREAL SHOCK WAVE LITHOTRIPSY (ESWL);  Surgeon:  05/14/2020, MD;  Location: [facility] ORS;  Service: Urology;  Laterality: Left;   KIDNEY STONE SURGERY      Current Outpatient Medications on File Prior to Visit  Medication Sig Dispense Refill   JUNEL FE 1.5/30 1.5-30 MG-MCG tablet TAKE 1 TABLET BY MOUTH EVERY DAY 84 tablet 0   Current Facility-Administered Medications on File Prior to Visit  Medication Dose Route Frequency Provider Last Rate Last Admin   ondansetron (ZOFRAN) injection 4 mg  4 mg Intravenous Once 07-28-1978, MD        No Known Allergies  Social History:  reports that she has been smoking cigars. She has been smoking an average of .25 packs per day. She has never used smokeless tobacco. She reports current alcohol use. She reports current drug use. Drug: Marijuana.  Family History  Problem Relation Age of Onset   Hypertension Father    Breast cancer Neg Hx    Ovarian cancer Neg Hx    Colon cancer Neg Hx    Diabetes Neg Hx     The following portions of the patient's history were reviewed and updated as appropriate: allergies, current medications, past family history, past medical history, past social history, past surgical history and problem list.  Review of Systems Pertinent items noted in HPI and remainder of comprehensive  ROS otherwise negative.  Physical Exam:  BP 128/82   Pulse 92   Ht 5\' 5"  (1.651 m)   Wt 172 lb 1.6 oz (78.1 kg)   LMP 07/05/2021 (Approximate)   BMI 28.64 kg/m  CONSTITUTIONAL: Well-developed, well-nourished female in no acute distress.  HENT:  Normocephalic, atraumatic, External right and left ear normal. Oropharynx is clear and moist EYES: Conjunctivae and EOM are normal. Pupils are equal, round, and reactive to light. No scleral icterus.  NECK: Normal range of motion, supple, no masses.  Normal thyroid.  SKIN: Skin is warm and dry. No rash noted. Not diaphoretic. No erythema. No pallor. MUSCULOSKELETAL: Normal range of motion. No tenderness.  No cyanosis, clubbing, or edema.   2+ distal pulses. NEUROLOGIC: Alert and oriented to person, place, and time. Normal reflexes, muscle tone coordination.  PSYCHIATRIC: Normal mood and affect. Normal behavior. Normal judgment and thought content. CARDIOVASCULAR: Normal heart rate noted, regular rhythm RESPIRATORY: Clear to auscultation bilaterally. Effort and breath sounds normal, no problems with respiration noted. BREASTS: Symmetric in size. No masses, tenderness, skin changes, nipple drainage, or lymphadenopathy bilaterally.  ABDOMEN: Soft, no distention noted.  No tenderness, rebound or guarding.  PELVIC: Normal appearing external genitalia and urethral meatus; normal appearing vaginal mucosa and cervix.  No abnormal discharge noted.  Pap smear obtained.  Nabothian cyst on cervix, contact bleeding.  Normal uterine size, no other palpable masses, no uterine or adnexal tenderness.  .   Assessment and Plan:    1. Women's annual routine gynecological examination   Pap:Will follow up results of pap smear and manage accordingly. Mammogram : n/a  Labs: Hep C, lipid Declines Flu  Refills: ocp Referral: none Routine preventative health maintenance measures emphasized. Please refer to After Visit Summary for other counseling recommendations.      09/04/2021, CNM [facility],  [facility]

## 2021-08-01 LAB — LIPID PANEL
Chol/HDL Ratio: 3 ratio (ref 0.0–4.4)
Cholesterol, Total: 125 mg/dL (ref 100–199)
HDL: 41 mg/dL (ref >39)
LDL Chol Calc (NIH): 71 mg/dL (ref 0–99)
Triglycerides: 60 mg/dL (ref 0–149)
VLDL Cholesterol Cal: 13 mg/dL (ref 5–40)

## 2021-08-01 LAB — HEPATITIS C ANTIBODY: Hep C Virus Ab: <0.1 s/co ratio (ref 0.0–0.9)

## 2021-08-02 ENCOUNTER — Other Ambulatory Visit: Payer: Self-pay

## 2021-08-02 ENCOUNTER — Other Ambulatory Visit: Payer: Self-pay | Admitting: Certified Nurse Midwife

## 2021-08-02 DIAGNOSIS — B9689 Other specified bacterial agents as the cause of diseases classified elsewhere: Secondary | ICD-10-CM

## 2021-08-02 LAB — CERVICOVAGINAL ANCILLARY ONLY
Bacterial Vaginitis (gardnerella): POSITIVE — AB
Candida Glabrata: NEGATIVE
Candida Vaginitis: NEGATIVE
Chlamydia: NEGATIVE
Comment: NEGATIVE
Comment: NEGATIVE
Comment: NEGATIVE
Comment: NEGATIVE
Comment: NEGATIVE
Comment: NORMAL
Neisseria Gonorrhea: NEGATIVE
Trichomonas: NEGATIVE

## 2021-08-02 MED ORDER — FLUCONAZOLE 150 MG PO TABS: 150.0000 mg | ORAL_TABLET | Freq: Every day | ORAL | 0 refills | Status: DC

## 2021-08-02 MED ORDER — METRONIDAZOLE 500 MG PO TABS: 500.0000 mg | ORAL_TABLET | Freq: Two times a day (BID) | ORAL | 0 refills | Status: AC

## 2021-08-04 LAB — CYTOLOGY - PAP
Comment: NEGATIVE
Diagnosis: NEGATIVE
High risk HPV: NEGATIVE

## 2021-09-13 ENCOUNTER — Other Ambulatory Visit: Payer: Self-pay

## 2021-09-13 ENCOUNTER — Emergency Department: Payer: Self-pay

## 2021-09-13 DIAGNOSIS — Z20822 Contact with and (suspected) exposure to covid-19: Secondary | ICD-10-CM | POA: Insufficient documentation

## 2021-09-13 DIAGNOSIS — J101 Influenza due to other identified influenza virus with other respiratory manifestations: Secondary | ICD-10-CM | POA: Insufficient documentation

## 2021-09-13 LAB — RESP PANEL BY RT-PCR (FLU A&B, COVID) ARPGX2
Influenza A by PCR: POSITIVE — AB
Influenza B by PCR: NEGATIVE
SARS Coronavirus 2 by RT PCR: NEGATIVE

## 2021-09-13 MED ORDER — ACETAMINOPHEN 325 MG PO TABS
650.0000 mg | ORAL_TABLET | Freq: Once | ORAL | Status: AC | PRN
  Administered 2021-09-13: 19:00:00 650 mg via ORAL
  Filled 2021-09-13: qty 2

## 2021-09-13 NOTE — ED Triage Notes (Signed)
Pt via POV reporting body aches, fevers, chills, cough since yesterday. Pt is febrile in triage. N/V earlier today but not this afternoon.

## 2021-09-14 ENCOUNTER — Emergency Department
Admission: EM | Admit: 2021-09-14 | Discharge: 2021-09-14 | Disposition: A | Discharge status: Home / Self Care | Payer: Self-pay | Attending: Emergency Medicine | Admitting: Emergency Medicine

## 2021-09-14 DIAGNOSIS — J101 Influenza due to other identified influenza virus with other respiratory manifestations: Secondary | ICD-10-CM

## 2021-09-14 MED ORDER — ONDANSETRON HCL 4 MG PO TABS: 4.0000 mg | ORAL_TABLET | Freq: Three times a day (TID) | ORAL | 0 refills | Status: DC | PRN

## 2021-09-14 MED ORDER — ONDANSETRON 4 MG PO TBDP
4.0000 mg | ORAL_TABLET | Freq: Once | ORAL | Status: AC
  Administered 2021-09-14: 02:00:00 4 mg via ORAL
  Filled 2021-09-14: qty 1

## 2021-09-14 MED ORDER — IBUPROFEN 400 MG PO TABS
400.0000 mg | ORAL_TABLET | Freq: Once | ORAL | Status: AC
  Administered 2021-09-14: 02:00:00 400 mg via ORAL
  Filled 2021-09-14: qty 1

## 2021-09-14 MED ORDER — OSELTAMIVIR PHOSPHATE 75 MG PO CAPS: 75.0000 mg | ORAL_CAPSULE | Freq: Two times a day (BID) | ORAL | 0 refills | Status: AC

## 2021-09-14 NOTE — ED Provider Notes (Signed)
[facility] Emergency Department Provider Note  ____________________________________________   Event Date/Time   First MD Initiated Contact with Patient 09/14/21 0157     (approximate)  I have reviewed the triage vital signs and the nursing notes.   HISTORY  Chief Complaint Fever   HPI Barbara Mendez is a 30 y.o. female with below noted past medical history who presents for assessment of cough, congestion, nonbloody nonbilious vomiting, body aches, fevers, chills and malaise started yesterday.  No medications prior to arrival.  Patient denies any specific focal pain such as in the chest abdomen or back that she hurts everywhere.  No rashes, recent falls or injuries, illicit drug or significant EtOH use although patient does endorse tobacco abuse.  No significant shortness of breath or chest pain.  No other acute concerns at this time.         Past Medical History:  Diagnosis Date   Hypotension    Kidney stone 06/02/2018    Patient Active Problem List   Diagnosis Date Noted   Overweight (BMI 25.0-29.9) 06/28/2017   Current mild episode of major depressive disorder without prior episode (HCC) 06/28/2017   History of tobacco abuse 03/10/2015    Past Surgical History:  Procedure Laterality Date   CESAREAN SECTION     CESAREAN SECTION N/A 06/13/2015   Procedure: CESAREAN SECTION;  Surgeon: Hildred Laser, MD;  Location: [facility] ORS;  Service: Obstetrics;  Laterality: N/A;   EXTRACORPOREAL SHOCK WAVE LITHOTRIPSY Left 05/12/2020   Procedure: EXTRACORPOREAL SHOCK WAVE LITHOTRIPSY (ESWL);  Surgeon: Sondra Come, MD;  Location: [facility] ORS;  Service: Urology;  Laterality: Left;   KIDNEY STONE SURGERY      Prior to Admission medications   Medication Sig Start Date End Date Taking? Authorizing Provider  ondansetron (ZOFRAN) 4 MG tablet Take 1 tablet (4 mg total) by mouth every 8 (eight) hours as needed for up to 10 doses for nausea or vomiting. 09/14/21  Yes  Gilles Chiquito, MD  oseltamivir (TAMIFLU) 75 MG capsule Take 1 capsule (75 mg total) by mouth 2 (two) times daily for 5 days. 09/14/21 09/19/21 Yes Gilles Chiquito, MD  fluconazole (DIFLUCAN) 150 MG tablet Take 1 tablet (150 mg total) by mouth daily. 08/02/21   Doreene Burke, CNM  JUNEL FE 1.5/30 1.5-30 MG-MCG tablet TAKE 1 TABLET BY MOUTH EVERY DAY 06/30/21   Hildred Laser, MD    Allergies Patient has no known allergies.  Family History  Problem Relation Age of Onset   Hypertension Father    Breast cancer Neg Hx    Ovarian cancer Neg Hx    Colon cancer Neg Hx    Diabetes Neg Hx     Social History Social History   Tobacco Use   Smoking status: Every Day    Packs/day: 0.25    Types: Cigars, Cigarettes    Last attempt to quit: 09/24/2014    Years since quitting: 6.9   Smokeless tobacco: Never  Vaping Use   Vaping Use: Never used  Substance Use Topics   Alcohol use: Yes    Comment: occass   Drug use: Yes    Types: Marijuana    Review of Systems  Review of Systems  Constitutional:  Positive for chills and fever.  HENT:  Negative for sore throat.   Eyes:  Negative for pain.  Respiratory:  Positive for cough. Negative for stridor.   Cardiovascular:  Negative for chest pain.  Gastrointestinal:  Positive for nausea and vomiting.  Genitourinary:  Negative for dysuria.  Musculoskeletal:  Positive for myalgias.  Skin:  Negative for rash.  Neurological:  Negative for seizures, loss of consciousness and headaches.  Psychiatric/Behavioral:  Negative for suicidal ideas.   All other systems reviewed and are negative.    ____________________________________________   PHYSICAL EXAM:  VITAL SIGNS: ED Triage Vitals [09/13/21 1919]  Enc Vitals Group     BP (!) 133/91     Pulse Rate (!) 117     Resp 18     Temp (!) 101.2 F (38.4 C)     Temp Source Oral     SpO2 95 %     Weight 172 lb (78 kg)     Height 5\' 5"  (1.651 m)     Head Circumference      Peak Flow       Pain Score 8     Pain Loc      Pain Edu?      Excl. in GC?    Vitals:   09/13/21 1919  BP: (!) 133/91  Pulse: (!) 117  Resp: 18  Temp: (!) 101.2 F (38.4 C)  SpO2: 95%   Physical Exam Vitals and nursing note reviewed.  Constitutional:      General: She is not in acute distress.    Appearance: She is well-developed.  HENT:     Head: Normocephalic and atraumatic.     Right Ear: External ear normal.     Left Ear: External ear normal.     Nose: Nose normal.     Mouth/Throat:     Mouth: Mucous membranes are moist.  Eyes:     Conjunctiva/sclera: Conjunctivae normal.  Cardiovascular:     Rate and Rhythm: Regular rhythm. Tachycardia present.     Heart sounds: No murmur heard. Pulmonary:     Effort: Pulmonary effort is normal. No respiratory distress.     Breath sounds: Normal breath sounds.  Abdominal:     Palpations: Abdomen is soft.     Tenderness: There is no abdominal tenderness.  Musculoskeletal:        General: No swelling.     Cervical back: Neck supple.  Skin:    General: Skin is warm and dry.     Capillary Refill: Capillary refill takes less than 2 seconds.  Neurological:     Mental Status: She is alert and oriented to person, place, and time.  Psychiatric:        Mood and Affect: Mood normal.     ____________________________________________   LABS (all labs ordered are listed, but only abnormal results are displayed)  Labs Reviewed  RESP PANEL BY RT-PCR (FLU A&B, COVID) ARPGX2 - Abnormal; Notable for the following components:      Result Value   Influenza A by PCR POSITIVE (*)    All other components within normal limits   ____________________________________________  EKG  ____________________________________________  RADIOLOGY  ED MD interpretation:  Chest x-ray shows no focal consolidation, effusion, edema, pneumothorax or other clear acute thoracic process.  Official radiology report(s): DG Chest 2 View  Result Date: 09/13/2021 CLINICAL  DATA:  Cough.  Body aches and fever with chills. EXAM: CHEST - 2 VIEW COMPARISON:  06/09/2021 FINDINGS: The heart size and mediastinal contours are within normal limits. Both lungs are clear. The visualized skeletal structures are unremarkable. IMPRESSION: No active cardiopulmonary disease. Electronically Signed   By: 06/11/2021 M.D.   On: 09/13/2021 21:28    ____________________________________________   PROCEDURES  Procedure(s) performed (  including Critical Care):  Procedures   ____________________________________________   INITIAL IMPRESSION / ASSESSMENT AND PLAN / ED COURSE      Patient presents with above-stated history exam for assessment of second day of above constellation of symptoms.  On arrival she is febrile and tachycardic without stable vital signs.  Impression is acute viral syndrome.  Her influenza PCR is positive and her COVID is negative.  I suspect this is the cause of her symptoms.  Chest x-ray has no focal consolidation and there are no abnormal breath sounds or evidence of respiratory distress to suggest a bacterial pneumonia at this time.  No evidence of deep space infection of the head or neck at this time.  I have a low suspicion for sepsis or other significant metabolic derangement as patient does not appear significantly dehydrated.  I think patient's tachycardia secondary to her fever.  Will write for Rx of Zofran.  Discussed Tamiflu and that this could significantly increase patient's GI symptoms and put her at risk for dehydration although she states she only wants this Rx.  Will write for short course of this and advised her to discontinue it if she finds it is worsening her GI symptoms at all.  She will follow-up with PCP.  Counseled on tobacco cessation.  Recommended Tylenol ibuprofen for fever and body aches.  Discharged in stable condition.        ____________________________________________   FINAL CLINICAL IMPRESSION(S) / ED DIAGNOSES  Final  diagnoses:  Influenza A    Medications  ondansetron (ZOFRAN-ODT) disintegrating tablet 4 mg (has no administration in time range)  ibuprofen (ADVIL) tablet 400 mg (has no administration in time range)  acetaminophen (TYLENOL) tablet 650 mg (650 mg Oral Given 09/13/21 1926)     ED Discharge Orders          Ordered    ondansetron (ZOFRAN) 4 MG tablet  Every 8 hours PRN        09/14/21 0204    oseltamivir (TAMIFLU) 75 MG capsule  2 times daily        09/14/21 0204             Note:  This document was prepared using Dragon voice recognition software and may include unintentional dictation errors.    Gilles Chiquito, MD 09/14/21 [phone]

## 2021-09-14 NOTE — ED Notes (Signed)
Pt evaluated in triage by provider

## 2021-09-18 ENCOUNTER — Other Ambulatory Visit: Payer: Self-pay | Admitting: Obstetrics and Gynecology

## 2021-09-19 NOTE — Telephone Encounter (Signed)
Patient needs her annual scheduled before we refill. Currently was canceled in January and not put back on until November of 2023.

## 2021-09-28 ENCOUNTER — Encounter: Payer: Self-pay | Admitting: Obstetrics and Gynecology

## 2021-10-06 NOTE — Telephone Encounter (Signed)
Patient had annual exam in November with Deneise Lever.

## 2021-10-31 ENCOUNTER — Other Ambulatory Visit: Payer: Self-pay | Admitting: Obstetrics and Gynecology

## 2022-02-14 IMAGING — DX DG CHEST 1V PORT
1 series · 1 of 1 positions shown · non-contrast
Comparison: None.

CLINICAL DATA: Productive cough, abnormal breath sounds

EXAM:
PORTABLE CHEST 1 VIEW

[chest ap]
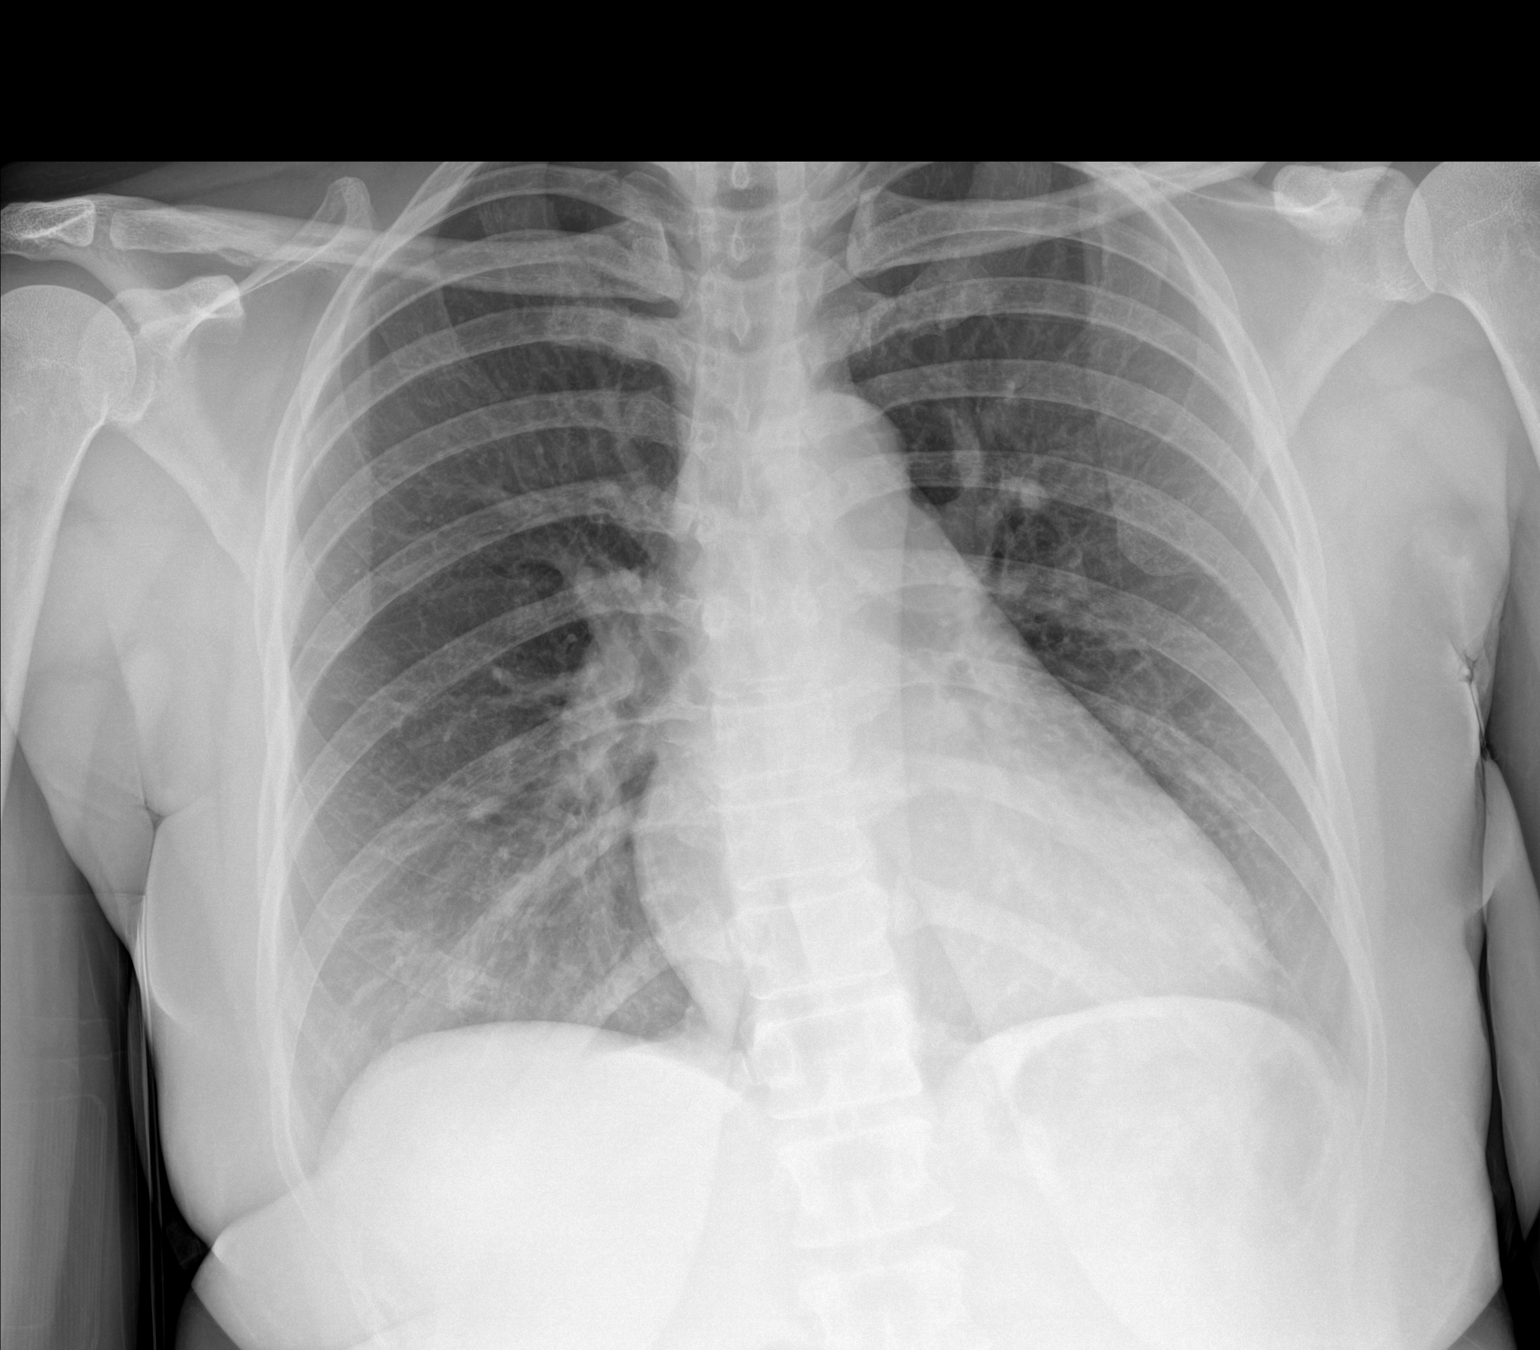

[1 of 1 positions shown; findings below may reference images not displayed]

FINDINGS: The heart size and mediastinal contours are within normal limits.
Both lungs are clear. The visualized skeletal structures are
unremarkable.
IMPRESSION: No active disease.

## 2022-05-21 IMAGING — CR DG CHEST 2V
1 series · 2 of 2 positions shown · non-contrast
Comparison: 06/09/2021

CLINICAL DATA: Cough.  Body aches and fever with chills.

EXAM:
CHEST - 2 VIEW

[Series 1: dg chest 2 view · 0.14mm/px · 2 of 2 slices shown]
[im 1/2]
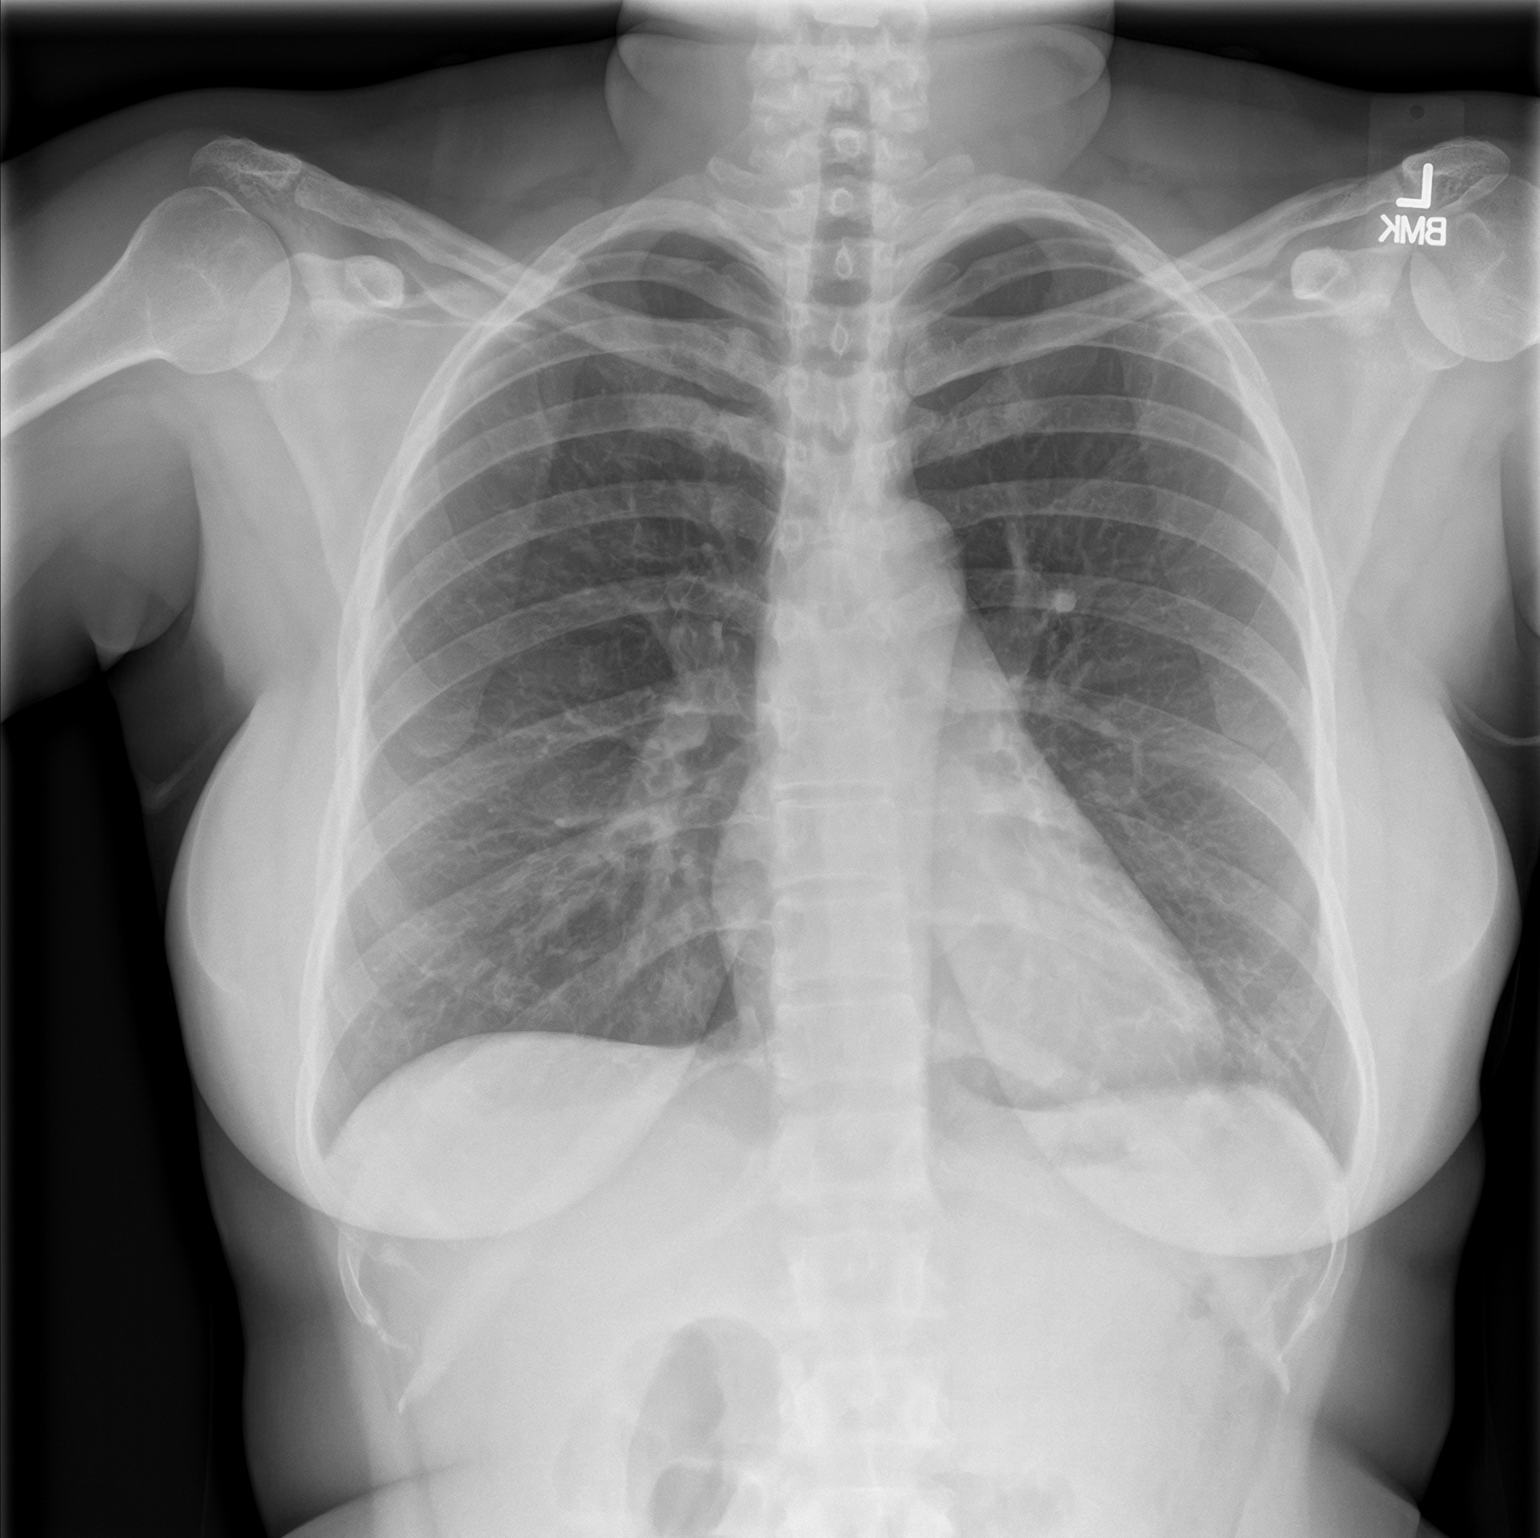
[im 2/2]
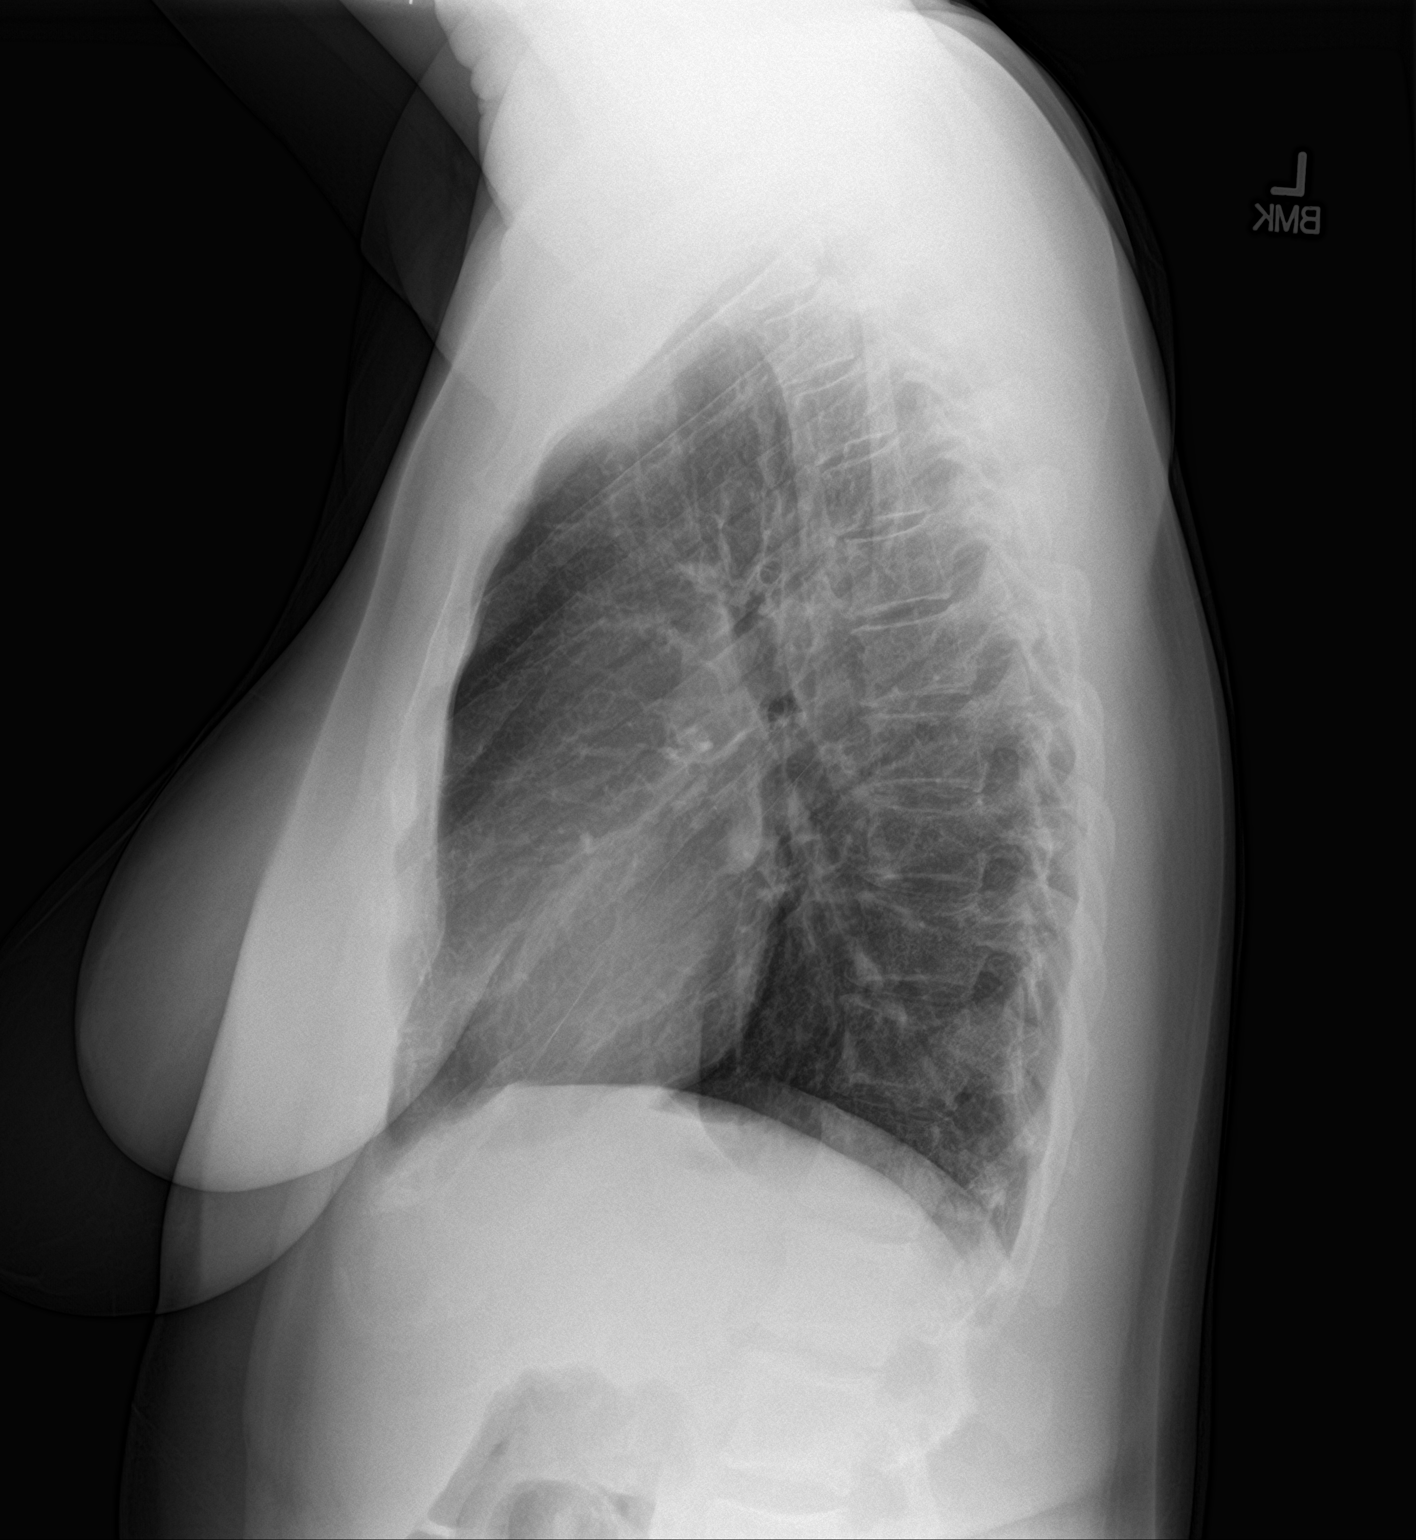

[2 of 2 positions shown; findings below may reference images not displayed]

FINDINGS: The heart size and mediastinal contours are within normal limits.
Both lungs are clear. The visualized skeletal structures are
unremarkable.
IMPRESSION: No active cardiopulmonary disease.

## 2022-08-01 ENCOUNTER — Encounter: Payer: Self-pay | Admitting: Obstetrics and Gynecology

## 2022-08-27 NOTE — Progress Notes (Unsigned)
GYNECOLOGY ANNUAL PHYSICAL EXAM PROGRESS NOTE  Subjective:    Barbara Mendez [place] is a 31 y.o. [phone] female who presents for an annual exam. The patient has no complaints today. The patient {is/is not/has never been:13135} sexually active. The patient participates in regular exercise: {yes/no/not asked:9010}. Has the patient ever been transfused or tattooed?: {yes/no/not asked:9010}. The patient reports that there {is/is not:9024} domestic violence in her life.    Menstrual History: Menarche age: *** No LMP recorded. (Menstrual status: Oral contraceptives).     Gynecologic History:  Contraception: {method:5051} History of STI's:  Last Pap: ***. Results were: {norm/abn:16337}.  ***Denies/Notes h/o abnormal pap smears. Last mammogram: ***. Results were: {norm/abn:16337}       OB History  Gravida Para Term Preterm AB Living  3 2 2  0 1 2  SAB IAB Ectopic Multiple Live Births  1 0 0 0 2    # Outcome Date GA Lbr Len/2nd Weight Sex Delivery Anes PTL Lv  3 Term 06/13/15 [redacted]w[redacted]d  5 lb 15.6 oz (2.71 kg) F CS-LTranv Spinal  LIV     Name: CAMPBELL,GIRL Naiomy     Apgar1: 8  Apgar5: 9  2 SAB 08/24/14     SAB   FD  1 Term 2010   8 lb (3.629 kg) F CS-LTranv  N LIV    Past Medical History:  Diagnosis Date   Hypotension    Kidney stone 06/02/2018    Past Surgical History:  Procedure Laterality Date   CESAREAN SECTION     CESAREAN SECTION N/A 06/13/2015   Procedure: CESAREAN SECTION;  Surgeon: 06/15/2015, MD;  Location: [facility] ORS;  Service: Obstetrics;  Laterality: N/A;   EXTRACORPOREAL SHOCK WAVE LITHOTRIPSY Left 05/12/2020   Procedure: EXTRACORPOREAL SHOCK WAVE LITHOTRIPSY (ESWL);  Surgeon: 05/14/2020, MD;  Location: [facility] ORS;  Service: Urology;  Laterality: Left;   KIDNEY STONE SURGERY      Family History  Problem Relation Age of Onset   Hypertension Father    Breast cancer Neg Hx    Ovarian cancer Neg Hx    Colon cancer Neg Hx    Diabetes Neg Hx     Social  History   Socioeconomic History   Marital status: Married    Spouse name: Not on file   Number of children: Not on file   Years of education: Not on file   Highest education level: Not on file  Occupational History   Not on file  Tobacco Use   Smoking status: Every Day    Packs/day: 0.25    Types: Cigars, Cigarettes    Last attempt to quit: 09/24/2014    Years since quitting: 7.9   Smokeless tobacco: Never  Vaping Use   Vaping Use: Never used  Substance and Sexual Activity   Alcohol use: Yes    Comment: occass   Drug use: Yes    Types: Marijuana   Sexual activity: Yes    Birth control/protection: None, Pill  Other Topics Concern   Not on file  Social History Narrative   Not on file   Social Determinants of Health   Financial Resource Strain: Not on file  Food Insecurity: Not on file  Transportation Needs: Not on file  Physical Activity: Not on file  Stress: Not on file  Social Connections: Not on file  Intimate Partner Violence: Not on file    Current Outpatient Medications on File Prior to Visit  Medication Sig Dispense Refill   fluconazole (DIFLUCAN) 150  MG tablet Take 1 tablet (150 mg total) by mouth daily. 1 tablet 0   JUNEL FE 1.5/30 1.5-30 MG-MCG tablet TAKE 1 TABLET BY MOUTH EVERY DAY 84 tablet 3   ondansetron (ZOFRAN) 4 MG tablet Take 1 tablet (4 mg total) by mouth every 8 (eight) hours as needed for up to 10 doses for nausea or vomiting. 10 tablet 0   No current facility-administered medications on file prior to visit.    No Known Allergies   Review of Systems Constitutional: negative for chills, fatigue, fevers and sweats Eyes: negative for irritation, redness and visual disturbance Ears, nose, mouth, throat, and face: negative for hearing loss, nasal congestion, snoring and tinnitus Respiratory: negative for asthma, cough, sputum Cardiovascular: negative for chest pain, dyspnea, exertional chest pressure/discomfort, irregular heart beat,  palpitations and syncope Gastrointestinal: negative for abdominal pain, change in bowel habits, nausea and vomiting Genitourinary: negative for abnormal menstrual periods, genital lesions, sexual problems and vaginal discharge, dysuria and urinary incontinence Integument/breast: negative for breast lump, breast tenderness and nipple discharge Hematologic/lymphatic: negative for bleeding and easy bruising Musculoskeletal:negative for back pain and muscle weakness Neurological: negative for dizziness, headaches, vertigo and weakness Endocrine: negative for diabetic symptoms including polydipsia, polyuria and skin dryness Allergic/Immunologic: negative for hay fever and urticaria      Objective:  There were no vitals taken for this visit. There is no height or weight on file to calculate BMI.    General Appearance:    Alert, cooperative, no distress, appears stated age  Head:    Normocephalic, without obvious abnormality, atraumatic  Eyes:    PERRL, conjunctiva/corneas clear, EOM's intact, both eyes  Ears:    Normal external ear canals, both ears  Nose:   Nares normal, septum midline, mucosa normal, no drainage or sinus tenderness  Throat:   Lips, mucosa, and tongue normal; teeth and gums normal  Neck:   Supple, symmetrical, trachea midline, no adenopathy; thyroid: no enlargement/tenderness/nodules; no carotid bruit or JVD  Back:     Symmetric, no curvature, ROM normal, no CVA tenderness  Lungs:     Clear to auscultation bilaterally, respirations unlabored  Chest Wall:    No tenderness or deformity   Heart:    Regular rate and rhythm, S1 and S2 normal, no murmur, rub or gallop  Breast Exam:    No tenderness, masses, or nipple abnormality  Abdomen:     Soft, non-tender, bowel sounds active all four quadrants, no masses, no organomegaly.    Genitalia:    Pelvic:external genitalia normal, vagina without lesions, discharge, or tenderness, rectovaginal septum  normal. Cervix normal in appearance,  no cervical motion tenderness, no adnexal masses or tenderness.  Uterus normal size, shape, mobile, regular contours, nontender.  Rectal:    Normal external sphincter.  No hemorrhoids appreciated. Internal exam not done.   Extremities:   Extremities normal, atraumatic, no cyanosis or edema  Pulses:   2+ and symmetric all extremities  Skin:   Skin color, texture, turgor normal, no rashes or lesions  Lymph nodes:   Cervical, supraclavicular, and axillary nodes normal  Neurologic:   CNII-XII intact, normal strength, sensation and reflexes throughout   .  Labs:  Lab Results  Component Value Date   WBC 10.6 (H) 05/10/2020   HGB 13.2 05/10/2020   HCT 39.0 05/10/2020   MCV 91.1 05/10/2020   PLT 320 05/10/2020    Lab Results  Component Value Date   CREATININE 0.67 05/10/2020   BUN 9 05/10/2020   NA  136 05/10/2020   K 3.8 05/10/2020   CL 105 05/10/2020   CO2 24 05/10/2020    Lab Results  Component Value Date   ALT 12 06/02/2018   AST 13 (L) 06/02/2018   ALKPHOS 50 06/02/2018   BILITOT 0.6 06/02/2018    No results found for: "TSH"   Assessment:   No diagnosis found.   Plan:  Blood tests: {blood tests:13147}. Breast self exam technique reviewed and patient encouraged to perform self-exam monthly. Contraception: {contraceptive methods:5051}. Discussed healthy lifestyle modifications. Mammogram {discussed/ordered:14545} Pap smear {discussed/ordered:14545}. COVID vaccination status: Follow up in 1 year for annual exam   Tommie Raymond, CMA [facility]

## 2022-08-28 ENCOUNTER — Other Ambulatory Visit (HOSPITAL_COMMUNITY)
Admission: RE | Admit: 2022-08-28 | Discharge: 2022-08-28 | Disposition: A | Discharge status: Home / Self Care | Payer: Self-pay | Source: Ambulatory Visit | Attending: Obstetrics and Gynecology | Admitting: Obstetrics and Gynecology

## 2022-08-28 ENCOUNTER — Encounter: Payer: Self-pay | Admitting: Obstetrics and Gynecology

## 2022-08-28 ENCOUNTER — Ambulatory Visit (INDEPENDENT_AMBULATORY_CARE_PROVIDER_SITE_OTHER): Payer: Self-pay | Admitting: Obstetrics and Gynecology

## 2022-08-28 VITALS — BP 148/101 | HR 88 | Resp 16 | Ht 65.0 in | Wt 167.5 lb

## 2022-08-28 DIAGNOSIS — Z13220 Encounter for screening for lipoid disorders: Secondary | ICD-10-CM

## 2022-08-28 DIAGNOSIS — Z113 Encounter for screening for infections with a predominantly sexual mode of transmission: Secondary | ICD-10-CM | POA: Insufficient documentation

## 2022-08-28 DIAGNOSIS — Z01419 Encounter for gynecological examination (general) (routine) without abnormal findings: Secondary | ICD-10-CM

## 2022-08-28 DIAGNOSIS — Z3041 Encounter for surveillance of contraceptive pills: Secondary | ICD-10-CM

## 2022-08-28 DIAGNOSIS — Z131 Encounter for screening for diabetes mellitus: Secondary | ICD-10-CM

## 2022-08-28 DIAGNOSIS — R030 Elevated blood-pressure reading, without diagnosis of hypertension: Secondary | ICD-10-CM

## 2022-08-28 MED ORDER — NORETHIN ACE-ETH ESTRAD-FE 1.5-30 MG-MCG PO TABS: 1.0000 | ORAL_TABLET | Freq: Every day | ORAL | 3 refills | Status: DC

## 2022-08-28 NOTE — Addendum Note (Signed)
Addended by: Tommie Raymond on: 08/28/2022 11:57 AM   Modules accepted: Orders

## 2022-08-28 NOTE — Patient Instructions (Signed)
Breast Self-Awareness Breast self-awareness is knowing how your breasts look and feel. You need to: Check your breasts on a regular basis. Tell your doctor about any changes. Become familiar with the look and feel of your breasts. This can help you catch a breast problem while it is still small and can be treated. You should do breast self-exams even if you have breast implants. What you need: A mirror. A well-lit room. A pillow or other soft object. How to do a breast self-exam Follow these steps to do a breast self-exam: Look for changes  Take off all the clothes above your waist. Stand in front of a mirror in a room with good lighting. Put your hands down at your sides. Compare your breasts in the mirror. Look for any difference between them, such as: A difference in shape. A difference in size. Wrinkles, dips, and bumps in one breast and not the other. Look at each breast for changes in the skin, such as: Redness. Scaly areas. Skin that has gotten thicker. Dimpling. Open sores (ulcers). Look for changes in your nipples, such as: Fluid coming out of a nipple. Fluid around a nipple. Bleeding. Dimpling. Redness. A nipple that looks pushed in (retracted), or that has changed position. Feel for changes Lie on your back. Feel each breast. To do this: Pick a breast to feel. Place a pillow under the shoulder closest to that breast. Put the arm closest to that breast behind your head. Feel the nipple area of that breast using the hand of your other arm. Feel the area with the pads of your three middle fingers by making small circles with your fingers. Use light, medium, and firm pressure. Continue the overlapping circles, moving downward over the breast. Keep making circles with your fingers. Stop when you feel your ribs. Start making circles with your fingers again, this time going upward until you reach your collarbone. Then, make circles outward across your breast and into your  armpit area. Squeeze your nipple. Check for discharge and lumps. Repeat these steps to check your other breast. Sit or stand in the tub or shower. With soapy water on your skin, feel each breast the same way you did when you were lying down. Write down what you find Writing down what you find can help you remember what to tell your doctor. Write down: What is normal for each breast. Any changes you find in each breast. These include: The kind of changes you find. A tender or painful breast. Any lump you find. Write down its size and where it is. When you last had your monthly period (menstrual cycle). General tips If you are breastfeeding, the best time to check your breasts is after you feed your baby or after you use a breast pump. If you get monthly bleeding, the best time to check your breasts is 5-7 days after your monthly cycle ends. With time, you will become comfortable with the self-exam. You will also start to know if there are changes in your breasts. Contact a doctor if: You see a change in the shape or size of your breasts or nipples. You see a change in the skin of your breast or nipples, such as red or scaly skin. You have fluid coming from your nipples that is not normal. You find a new lump or thick area. You have breast pain. You have any concerns about your breast health. Summary Breast self-awareness includes looking for changes in your breasts and feeling for changes   yoga, or listening to music. Journaling. Talking to a trusted person. Spending time with friends and family. Minimize exposure to UV radiation to reduce your risk of skin  cancer. Safety Always wear your seat belt while driving or riding in a vehicle. Do not drive: If you have been drinking alcohol. Do not ride with someone who has been drinking. If you have been using any mind-altering substances or drugs. While texting. When you are tired or distracted. Wear a helmet and other protective equipment during sports activities. If you have firearms in your house, make sure you follow all gun safety procedures. Seek help if you have been physically or sexually abused. What's next? Go to your health care provider once a year for an annual wellness visit. Ask your health care provider how often you should have your eyes and teeth checked. Stay up to date on all vaccines. This information is not intended to replace advice given to you by your health care provider. Make sure you discuss any questions you have with your health care provider. Document Revised: 03/08/2021 Document Reviewed: 03/08/2021 Elsevier Patient Education  2023 [employer] Inc[employer]    night with fluoride toothpaste. Floss one time each day. Exercise for at least 30 minutes 5 or more days each week. Do not use any products that contain nicotine or tobacco. These products include cigarettes, chewing tobacco, and vaping devices, such as e-cigarettes. If you need help quitting, ask your health care provider. Do not use drugs. If you are sexually active, practice safe sex. Use a condom or other form of protection to prevent STIs. If you do not wish to become pregnant, use a form of birth control. If you plan to become pregnant, see your health care provider for a prepregnancy visit. Find healthy ways to manage stress, such as: Meditation, yoga, or  listening to music. Journaling. Talking to a trusted person. Spending time with friends and family. Minimize exposure to UV radiation to reduce your risk of skin cancer. Safety Always wear your seat belt while driving or riding in a vehicle. Do not drive: If you have been drinking alcohol. Do not ride with someone who has been drinking. If you have been using any mind-altering substances or drugs. While texting. When you are tired or distracted. Wear a helmet and other protective equipment during sports activities. If you have firearms in your house, make sure you follow all gun safety procedures. Seek help if you have been physically or sexually abused. What's next? Go to your health care provider once a year for an annual wellness visit. Ask your health care provider how often you should have your eyes and teeth checked. Stay up to date on all vaccines. This information is not intended to replace advice given to you by your health care provider. Make sure you discuss any questions you have with your health care provider. Document Revised: 03/08/2021 Document Reviewed: 03/08/2021 Elsevier Patient Education  2023 [employer]vier Inc.  

## 2022-08-29 ENCOUNTER — Other Ambulatory Visit: Payer: Self-pay | Admitting: Obstetrics and Gynecology

## 2022-08-29 ENCOUNTER — Encounter: Payer: Self-pay | Admitting: Obstetrics and Gynecology

## 2022-08-29 DIAGNOSIS — R768 Other specified abnormal immunological findings in serum: Secondary | ICD-10-CM | POA: Insufficient documentation

## 2022-08-29 DIAGNOSIS — R7689 Other specified abnormal immunological findings in serum: Secondary | ICD-10-CM | POA: Insufficient documentation

## 2022-08-29 DIAGNOSIS — B009 Herpesviral infection, unspecified: Secondary | ICD-10-CM | POA: Insufficient documentation

## 2022-08-29 HISTORY — DX: Other specified abnormal immunological findings in serum: R76.8

## 2022-08-29 LAB — CERVICOVAGINAL ANCILLARY ONLY
Bacterial Vaginitis (gardnerella): POSITIVE — AB
Candida Glabrata: NEGATIVE
Candida Vaginitis: NEGATIVE
Chlamydia: NEGATIVE
Comment: NEGATIVE
Comment: NEGATIVE
Comment: NEGATIVE
Comment: NEGATIVE
Comment: NEGATIVE
Comment: NORMAL
Neisseria Gonorrhea: NEGATIVE
Trichomonas: NEGATIVE

## 2022-08-29 LAB — LIPID PANEL
Chol/HDL Ratio: 3.2 ratio (ref 0.0–4.4)
Cholesterol, Total: 129 mg/dL (ref 100–199)
HDL: 40 mg/dL (ref >39)
LDL Chol Calc (NIH): 76 mg/dL (ref 0–99)
Triglycerides: 62 mg/dL (ref 0–149)
VLDL Cholesterol Cal: 13 mg/dL (ref 5–40)

## 2022-08-29 LAB — HSV 1 AND 2 AB, IGG
HSV 1 Glycoprotein G Ab, IgG: <0.91 index (ref 0.00–0.90)
HSV 2 IgG, Type Spec: 16.6 index — ABNORMAL HIGH (ref 0.00–0.90)

## 2022-08-29 LAB — HEMOGLOBIN A1C
Est. average glucose Bld gHb Est-mCnc: 114 mg/dL
Hgb A1c MFr Bld: 5.6 % (ref 4.8–5.6)

## 2022-08-29 LAB — CBC
Hematocrit: 41.7 % (ref 34.0–46.6)
Hemoglobin: 14.5 g/dL (ref 11.1–15.9)
MCH: 31.6 pg (ref 26.6–33.0)
MCHC: 34.8 g/dL (ref 31.5–35.7)
MCV: 91 fL (ref 79–97)
Platelets: 350 10*3/uL (ref 150–450)
RBC: 4.59 x10E6/uL (ref 3.77–5.28)
RDW: 12.8 % (ref 11.7–15.4)
WBC: 8.5 10*3/uL (ref 3.4–10.8)

## 2022-08-29 LAB — COMPREHENSIVE METABOLIC PANEL
ALT: 12 IU/L (ref 0–32)
AST: 13 IU/L (ref 0–40)
Albumin/Globulin Ratio: 1.4 (ref 1.2–2.2)
Albumin: 4.1 g/dL (ref 3.9–4.9)
Alkaline Phosphatase: 66 IU/L (ref 44–121)
BUN/Creatinine Ratio: 12 (ref 9–23)
BUN: 9 mg/dL (ref 6–20)
Bilirubin Total: 0.3 mg/dL (ref 0.0–1.2)
CO2: 21 mmol/L (ref 20–29)
Calcium: 9 mg/dL (ref 8.7–10.2)
Chloride: 104 mmol/L (ref 96–106)
Creatinine, Ser: 0.74 mg/dL (ref 0.57–1.00)
Globulin, Total: 2.9 g/dL (ref 1.5–4.5)
Glucose: 73 mg/dL (ref 70–99)
Potassium: 3.9 mmol/L (ref 3.5–5.2)
Sodium: 138 mmol/L (ref 134–144)
Total Protein: 7 g/dL (ref 6.0–8.5)
eGFR: 109 mL/min/{1.73_m2} (ref >59)

## 2022-08-29 LAB — TSH: TSH: 1.18 u[IU]/mL (ref 0.450–4.500)

## 2022-08-29 LAB — RPR: RPR Ser Ql: NONREACTIVE

## 2022-08-29 LAB — HIV ANTIBODY (ROUTINE TESTING W REFLEX): HIV Screen 4th Generation wRfx: NONREACTIVE

## 2022-08-29 MED ORDER — METRONIDAZOLE 0.75 % VA GEL: 1.0000 | Freq: Every day | VAGINAL | 1 refills | Status: DC

## 2022-08-30 ENCOUNTER — Encounter: Payer: Self-pay | Admitting: Obstetrics and Gynecology

## 2022-09-04 ENCOUNTER — Encounter: Payer: Self-pay | Admitting: Obstetrics and Gynecology

## 2022-09-25 ENCOUNTER — Ambulatory Visit (INDEPENDENT_AMBULATORY_CARE_PROVIDER_SITE_OTHER): Payer: Self-pay | Admitting: Obstetrics and Gynecology

## 2022-09-25 ENCOUNTER — Encounter: Payer: Self-pay | Admitting: Obstetrics and Gynecology

## 2022-09-25 VITALS — BP 156/96 | HR 86 | Resp 16 | Ht 65.0 in | Wt 168.2 lb

## 2022-09-25 DIAGNOSIS — F17210 Nicotine dependence, cigarettes, uncomplicated: Secondary | ICD-10-CM

## 2022-09-25 DIAGNOSIS — E663 Overweight: Secondary | ICD-10-CM

## 2022-09-25 DIAGNOSIS — Z6825 Body mass index (BMI) 25.0-25.9, adult: Secondary | ICD-10-CM

## 2022-09-25 DIAGNOSIS — Z87891 Personal history of nicotine dependence: Secondary | ICD-10-CM

## 2022-09-25 DIAGNOSIS — I10 Essential (primary) hypertension: Secondary | ICD-10-CM

## 2022-09-25 MED ORDER — BLOOD PRESSURE KIT DEVI: 1.0000 | Freq: Every day | 0 refills | Status: AC

## 2022-09-25 MED ORDER — AMLODIPINE BESYLATE 5 MG PO TABS: 5.0000 mg | ORAL_TABLET | Freq: Every day | ORAL | 3 refills | Status: DC

## 2022-09-25 NOTE — Progress Notes (Signed)
    GYNECOLOGY PROGRESS NOTE  Subjective:    Patient ID: Barbara Mendez, female    DOB: Apr 08, 1991, 32 y.o.   MRN: [id number]  HPI  Patient is a 32 y.o. G68P2012 female who presents for 1 month follow up on blood pressure. Her blood pressure was elevated at her previous visit 148/101. She has no prior history of elevated blood pressure. Patient denies chest pain, palpitations, shortness of breath or visual disturbances. She is a current everyday smoker, that notices shortness of breath when she walks her dog. She does not take any blood pressure medications.  She does have a family history of HTN in her father.   The following portions of the patient's history were reviewed and updated as appropriate: allergies, current medications, past family history, past medical history, past social history, past surgical history, and problem list.  Review of Systems Pertinent items noted in HPI and remainder of comprehensive ROS otherwise negative.   Objective:  Body mass index is 27.99 kg/m.  Blood pressure (!) 156/96, pulse 86, resp. rate 16, height 5\' 5"  (1.651 m), weight 168 lb 3.2 oz (76.3 kg), last menstrual period 08/18/2022. Repeat Blood pressure (!) 151/103 General appearance: alert See recent wellness visit performed 08/28/22 for remainder of exam.    Assessment:   1. Essential hypertension   2. History of tobacco abuse   3. Overweight (BMI 25.0-29.9)     Plan:   Reviewed diet, exercise and weight control. Recommended sodium restriction. Very strongly urged to quit smoking to reduce cardiovascular risk. Offered assistance if needed.  Reviewed medications and side effects in detail. Will prescribe Norvasc 5 mg.  Follow up in 3 months.     A total of 20 minutes were spent face-to-face with the patient during this encounter and over half of that time dealt with counseling and coordination of care.   Rubie Maid, MD [facility]

## 2022-11-22 ENCOUNTER — Emergency Department
Admission: EM | Admit: 2022-11-22 | Discharge: 2022-11-22 | Disposition: A | Discharge status: Home / Self Care | Payer: Self-pay | Attending: Emergency Medicine | Admitting: Emergency Medicine

## 2022-11-22 ENCOUNTER — Other Ambulatory Visit: Payer: Self-pay

## 2022-11-22 ENCOUNTER — Encounter: Payer: Self-pay | Admitting: Emergency Medicine

## 2022-11-22 DIAGNOSIS — J101 Influenza due to other identified influenza virus with other respiratory manifestations: Secondary | ICD-10-CM | POA: Insufficient documentation

## 2022-11-22 DIAGNOSIS — Z1152 Encounter for screening for COVID-19: Secondary | ICD-10-CM | POA: Insufficient documentation

## 2022-11-22 DIAGNOSIS — Z8616 Personal history of COVID-19: Secondary | ICD-10-CM | POA: Insufficient documentation

## 2022-11-22 DIAGNOSIS — J111 Influenza due to unidentified influenza virus with other respiratory manifestations: Secondary | ICD-10-CM

## 2022-11-22 LAB — BASIC METABOLIC PANEL
Anion gap: 13 (ref 5–15)
BUN: 11 mg/dL (ref 6–20)
CO2: 19 mmol/L — ABNORMAL LOW (ref 22–32)
Calcium: 8.7 mg/dL — ABNORMAL LOW (ref 8.9–10.3)
Chloride: 100 mmol/L (ref 98–111)
Creatinine, Ser: 0.89 mg/dL (ref 0.44–1.00)
GFR, Estimated: >60 mL/min (ref >60)
Glucose, Bld: 117 mg/dL — ABNORMAL HIGH (ref 70–99)
Potassium: 3.8 mmol/L (ref 3.5–5.1)
Sodium: 132 mmol/L — ABNORMAL LOW (ref 135–145)

## 2022-11-22 LAB — CBC WITH DIFFERENTIAL/PLATELET
Abs Immature Granulocytes: 0.02 10*3/uL (ref 0.00–0.07)
Basophils Absolute: 0 10*3/uL (ref 0.0–0.1)
Basophils Relative: 0 %
Eosinophils Absolute: 0 10*3/uL (ref 0.0–0.5)
Eosinophils Relative: 0 %
HCT: 45.9 % (ref 36.0–46.0)
Hemoglobin: 15.6 g/dL — ABNORMAL HIGH (ref 12.0–15.0)
Immature Granulocytes: 0 %
Lymphocytes Relative: 27 %
Lymphs Abs: 1.4 10*3/uL (ref 0.7–4.0)
MCH: 29.9 pg (ref 26.0–34.0)
MCHC: 34 g/dL (ref 30.0–36.0)
MCV: 88.1 fL (ref 80.0–100.0)
Monocytes Absolute: 0.5 10*3/uL (ref 0.1–1.0)
Monocytes Relative: 10 %
Neutro Abs: 3.2 10*3/uL (ref 1.7–7.7)
Neutrophils Relative %: 63 %
Platelets: 251 10*3/uL (ref 150–400)
RBC: 5.21 MIL/uL — ABNORMAL HIGH (ref 3.87–5.11)
RDW: 13.5 % (ref 11.5–15.5)
WBC: 5.1 10*3/uL (ref 4.0–10.5)
nRBC: 0 % (ref 0.0–0.2)

## 2022-11-22 LAB — RESP PANEL BY RT-PCR (RSV, FLU A&B, COVID)  RVPGX2
Influenza A by PCR: NEGATIVE
Influenza B by PCR: POSITIVE — AB
Resp Syncytial Virus by PCR: NEGATIVE
SARS Coronavirus 2 by RT PCR: NEGATIVE

## 2022-11-22 MED ORDER — ONDANSETRON 4 MG PO TBDP: 4.0000 mg | ORAL_TABLET | Freq: Three times a day (TID) | ORAL | 0 refills | Status: DC | PRN

## 2022-11-22 MED ORDER — IBUPROFEN 100 MG/5ML PO SUSP
600.0000 mg | Freq: Once | ORAL | Status: AC
  Administered 2022-11-22: 600 mg via ORAL

## 2022-11-22 MED ORDER — KETOROLAC TROMETHAMINE 30 MG/ML IJ SOLN
30.0000 mg | Freq: Once | INTRAMUSCULAR | Status: AC
  Administered 2022-11-22: 30 mg via INTRAVENOUS
  Filled 2022-11-22: qty 1

## 2022-11-22 MED ORDER — ACETAMINOPHEN 160 MG/5ML PO SOLN
650.0000 mg | Freq: Once | ORAL | Status: DC
  Filled 2022-11-22: qty 20.3

## 2022-11-22 MED ORDER — IBUPROFEN 100 MG/5ML PO SUSP
ORAL | Status: AC
  Filled 2022-11-22: qty 30

## 2022-11-22 MED ORDER — PREDNISONE 20 MG PO TABS: 40.0000 mg | ORAL_TABLET | Freq: Every day | ORAL | 0 refills | Status: AC

## 2022-11-22 MED ORDER — ONDANSETRON 4 MG PO TBDP
8.0000 mg | ORAL_TABLET | Freq: Once | ORAL | Status: AC
  Administered 2022-11-22: 8 mg via ORAL
  Filled 2022-11-22: qty 2

## 2022-11-22 MED ORDER — SODIUM CHLORIDE 0.9 % IV BOLUS
1000.0000 mL | Freq: Once | INTRAVENOUS | Status: AC
  Administered 2022-11-22: 1000 mL via INTRAVENOUS

## 2022-11-22 MED ORDER — METOCLOPRAMIDE HCL 5 MG/ML IJ SOLN
10.0000 mg | Freq: Once | INTRAMUSCULAR | Status: AC
  Administered 2022-11-22: 10 mg via INTRAVENOUS
  Filled 2022-11-22: qty 2

## 2022-11-22 NOTE — Discharge Instructions (Addendum)
Your flu test was positive.  You have been treated with IV fluids, nausea medicine, and pain medicine.  Take the prescription nausea medicine and steroid as directed.  Take OTC Delsym for cough relief.  Follow-up with your primary provider or return to the ED if needed.

## 2022-11-22 NOTE — ED Notes (Signed)
  Patient given ibuprofen but threw it up shortly after. Patient states she has sensitive gag reflex and is unable to take a lot of medications by mouth.

## 2022-11-22 NOTE — ED Provider Notes (Signed)
[facility] Emergency Department Provider Note     Event Date/Time   First MD Initiated Contact with Patient 11/22/22 2051     (approximate)   History   Generalized Body Aches and Fever   HPI  Barbara Mendez is a 32 y.o. female with history of tobacco use and obesity, presents to the ED with 3-day history of generalized bodyaches and fevers.  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  She reports generalized bodyaches noting lower extremity pain.  Patient took TheraFlu but did not dose any antipyretics prior to arrival.  She presents febrile and hypotensive in triage.  No reports of any frank shortness of breath, chest pain, or dyspnea.  Physical Exam   Triage Vital Signs: ED Triage Vitals  Enc Vitals Group     BP 11/22/22 1920 (!) 86/71     Pulse Rate 11/22/22 1920 (!) 118     Resp 11/22/22 1920 (!) 22     Temp 11/22/22 1920 (!) 100.5 F (38.1 C)     Temp Source 11/22/22 1920 Oral     SpO2 11/22/22 1920 97 %     Weight 11/22/22 1921 158 lb (71.7 kg)     Height 11/22/22 1921 '5\' 5"'$  (1.651 m)     Head Circumference --      Peak Flow --      Pain Score 11/22/22 1920 9     Pain Loc --      Pain Edu? --      Excl. in [place]? --     Most recent vital signs: Vitals:   11/22/22 1920 11/22/22 1924  BP: (!) 86/71 94/70  Pulse: (!) 118   Resp: (!) 22   Temp: (!) 100.5 F (38.1 C)   SpO2: 97%     General Awake, no distress. NAD HEENT NCAT. PERRL. EOMI. No rhinorrhea. Mucous membranes are moist.  CV:  Good peripheral perfusion.  RESP:  Normal effort.  ABD:  No distention.    ED Results / Procedures / Treatments   Labs (all labs ordered are listed, but only abnormal results are displayed) Labs Reviewed  RESP PANEL BY RT-PCR (RSV, FLU A&B, COVID)  RVPGX2 - Abnormal; Notable for the following components:      Result Value   Influenza B by PCR POSITIVE (*)    All other components within normal limits   CBC WITH DIFFERENTIAL/PLATELET - Abnormal; Notable for the following components:   RBC 5.21 (*)    Hemoglobin 15.6 (*)    All other components within normal limits  BASIC METABOLIC PANEL - Abnormal; Notable for the following components:   Sodium 132 (*)    CO2 19 (*)    Glucose, Bld 117 (*)    Calcium 8.7 (*)    All other components within normal limits     EKG   RADIOLOGY   No results found.   PROCEDURES:  Critical Care performed: No  Procedures   MEDICATIONS ORDERED IN ED: Medications  ibuprofen (ADVIL) 100 MG/5ML suspension (  Not Given 11/22/22 1937)  ondansetron (ZOFRAN-ODT) disintegrating tablet 8 mg (8 mg Oral Given 11/22/22 1929)  ibuprofen (ADVIL) 100 MG/5ML suspension 600 mg (600 mg Oral Given 11/22/22 1935)  sodium chloride 0.9 % bolus 1,000 mL (1,000 mLs Intravenous New Bag/Given 11/22/22 2105)  ketorolac (TORADOL) 30 MG/ML injection 30 mg (30 mg Intravenous Given 11/22/22 2105)  metoCLOPramide (REGLAN) injection 10 mg (10 mg Intravenous Given 11/22/22  2112)     IMPRESSION / MDM / ASSESSMENT AND PLAN / ED COURSE  I reviewed the triage vital signs and the nursing notes.                              Differential diagnosis includes, but is not limited to, COVID, flu, RSV, strep pharyngitis  Patient's presentation is most consistent with acute presentation with potential threat to life or bodily function.  Patient's diagnosis is consistent with influenza.  Patient was treated symptomatically for her fever and tachycardia with IV fluid bolus as well as IV nausea medicine and pain medicine.  Patient will be discharged home with prescriptions for Zofran and prednisone. Patient is to follow up with primary provider as needed or otherwise directed. Patient is given ED precautions to return to the ED for any worsening or new symptoms.  FINAL CLINICAL IMPRESSION(S) / ED DIAGNOSES   Final diagnoses:  Influenza     Rx / DC Orders   ED Discharge Orders           Ordered    ondansetron (ZOFRAN-ODT) 4 MG disintegrating tablet  Every 8 hours PRN        11/22/22 2104    predniSONE (DELTASONE) 20 MG tablet  Daily with breakfast        11/22/22 2209             Note:  This document was prepared using Dragon voice recognition software and may include unintentional dictation errors.    Melvenia Needles, PA-C 11/22/22 2210    Blake Divine, MD 11/22/22 2351

## 2022-11-22 NOTE — ED Triage Notes (Addendum)
  Patient comes in with 3 day hx of fever and generalized body aches.  Patient states her daughter was recently diagnosed with flu and she was taking care of her.  Patient states pain is worse in lower extremities.  Took theraflu at 1745.  No antipyretics.  Febrile and hypotensive in triage.  Pain 9/10, sore/achy.   No SOB, or dyspnea.  Nausea in triage.

## 2022-12-20 NOTE — Progress Notes (Signed)
    GYNECOLOGY PROGRESS NOTE  Subjective:    Patient ID: Barbara Mendez, female    DOB: 1991/09/11, 32 y.o.   MRN: [id number]  HPI  Patient is a 32 y.o. G85P2012 female who presents for 3 month medication follow up on Norvasc 5 mg for newly diagnosed HTN. Notes that she has been trying to check BP at home but is inconsistent.  Also notes issues with cost of the medication, was $85, currently is uninsured. Also notes that her birth control cost went up as well. Denies any undesirable side effects.   Of note, patient reports that she stopped smoking off ~ 3 weeks, after being diagnosed with the flu, and have dental work done, however has recently picked back up with the habit.   The following portions of the patient's history were reviewed and updated as appropriate: allergies, current medications, past family history, past medical history, past social history, past surgical history, and problem list.  Review of Systems Pertinent items are noted in HPI.   Objective:   Blood pressure 124/80, pulse 94, height 5\' 5"  (1.651 m), weight 164 lb (74.4 kg). Body mass index is 27.29 kg/m. General appearance: alert, cooperative, and no distress Remainder of exam deferred.    Assessment:   1. Essential hypertension   2. Uses oral contraception   3. Tobacco abuse      Plan:    1. Essential hypertension - Can continue Norvasc. Advised to ensure that she is receiving generic instead of brand name, and can use GoodRx coupon to get medication for $9/month, can send to different pharmacy.   2. Uses oral contraception - Currently on combined OCPs, however will switch to progesterone only due to newly diagnosed HTN and current smoker. Will prescribe Micronor which will also be more cost-effective.   3. Tobacco abuse - Continued to encourage smoking cessation. Patient notes that she does desire to have smoking cessation by the end of this year. Can offer medication or therapy aids as desired.    Follow up as needed.    A total of 15 minutes were spent face-to-face with the patient during this encounter and over half of that time involved counseling and coordination of care.  Rubie Maid, MD [facility]

## 2022-12-20 NOTE — Patient Instructions (Signed)
Hypertension, Adult ?Hypertension is another name for high blood pressure. High blood pressure forces your heart to work harder to pump blood. This can cause problems over time. ?There are two numbers in a blood pressure reading. There is a top number (systolic) over a bottom number (diastolic). It is best to have a blood pressure that is below 120/80. ?What are the causes? ?The cause of this condition is not known. Some other conditions can lead to high blood pressure. ?What increases the risk? ?Some lifestyle factors can make you more likely to develop high blood pressure: ?Smoking. ?Not getting enough exercise or physical activity. ?Being overweight. ?Having too much fat, sugar, calories, or salt (sodium) in your diet. ?Drinking too much alcohol. ?Other risk factors include: ?Having any of these conditions: ?Heart disease. ?Diabetes. ?High cholesterol. ?Kidney disease. ?Obstructive sleep apnea. ?Having a family history of high blood pressure and high cholesterol. ?Age. The risk increases with age. ?Stress. ?What are the signs or symptoms? ?High blood pressure may not cause symptoms. Very high blood pressure (hypertensive crisis) may cause: ?Headache. ?Fast or uneven heartbeats (palpitations). ?Shortness of breath. ?Nosebleed. ?Vomiting or feeling like you may vomit (nauseous). ?Changes in how you see. ?Very bad chest pain. ?Feeling dizzy. ?Seizures. ?How is this treated? ?This condition is treated by making healthy lifestyle changes, such as: ?Eating healthy foods. ?Exercising more. ?Drinking less alcohol. ?Your doctor may prescribe medicine if lifestyle changes do not help enough and if: ?Your top number is above 130. ?Your bottom number is above 80. ?Your personal target blood pressure may vary. ?Follow these instructions at home: ?Eating and drinking ? ?If told, follow the DASH eating plan. To follow this plan: ?Fill one half of your plate at each meal with fruits and vegetables. ?Fill one fourth of your plate  at each meal with whole grains. Whole grains include whole-wheat pasta, brown rice, and whole-grain bread. ?Eat or drink low-fat dairy products, such as skim milk or low-fat yogurt. ?Fill one fourth of your plate at each meal with low-fat (lean) proteins. Low-fat proteins include fish, chicken without skin, eggs, beans, and tofu. ?Avoid fatty meat, cured and processed meat, or chicken with skin. ?Avoid pre-made or processed food. ?Limit the amount of salt in your diet to less than 1,500 mg each day. ?Do not drink alcohol if: ?Your doctor tells you not to drink. ?You are pregnant, may be pregnant, or are planning to become pregnant. ?If you drink alcohol: ?Limit how much you have to: ?0-1 drink a day for women. ?0-2 drinks a day for men. ?Know how much alcohol is in your drink. In the [place] one drink equals one 12 oz bottle of beer (355 mL), one 5 oz glass of wine (148 mL), or one 1? oz glass of hard liquor (44 mL). ?Lifestyle ? ?Work with your doctor to stay at a healthy weight or to lose weight. Ask your doctor what the best weight is for you. ?Get at least 30 minutes of exercise that causes your heart to beat faster (aerobic exercise) most days of the week. This may include walking, swimming, or biking. ?Get at least 30 minutes of exercise that strengthens your muscles (resistance exercise) at least 3 days a week. This may include lifting weights or doing Pilates. ?Do not smoke or use any products that contain nicotine or tobacco. If you need help quitting, ask your doctor. ?Check your blood pressure at home as told by your doctor. ?Keep all follow-up visits. ?Medicines ?Take over-the-counter and prescription medicines   only as told by your doctor. Follow directions carefully. ?Do not skip doses of blood pressure medicine. The medicine does not work as well if you skip doses. Skipping doses also puts you at risk for problems. ?Ask your doctor about side effects or reactions to medicines that you should watch  for. ?Contact a doctor if: ?You think you are having a reaction to the medicine you are taking. ?You have headaches that keep coming back. ?You feel dizzy. ?You have swelling in your ankles. ?You have trouble with your vision. ?Get help right away if: ?You get a very bad headache. ?You start to feel mixed up (confused). ?You feel weak or numb. ?You feel faint. ?You have very bad pain in your: ?Chest. ?Belly (abdomen). ?You vomit more than once. ?You have trouble breathing. ?These symptoms may be an emergency. Get help right away. Call 911. ?Do not wait to see if the symptoms will go away. ?Do not drive yourself to the hospital. ?Summary ?Hypertension is another name for high blood pressure. ?High blood pressure forces your heart to work harder to pump blood. ?For most people, a normal blood pressure is less than 120/80. ?Making healthy choices can help lower blood pressure. If your blood pressure does not get lower with healthy choices, you may need to take medicine. ?This information is not intended to replace advice given to you by your health care provider. Make sure you discuss any questions you have with your health care provider. ?Document Revised: 06/29/2021 Document Reviewed: 06/29/2021 ?Elsevier Patient Education ? 2023 Elsevier Inc. ? ?

## 2022-12-25 ENCOUNTER — Encounter: Payer: Self-pay | Admitting: Obstetrics and Gynecology

## 2022-12-25 ENCOUNTER — Ambulatory Visit (INDEPENDENT_AMBULATORY_CARE_PROVIDER_SITE_OTHER): Payer: Self-pay | Admitting: Obstetrics and Gynecology

## 2022-12-25 VITALS — BP 124/80 | HR 94 | Ht 65.0 in | Wt 164.0 lb

## 2022-12-25 DIAGNOSIS — I10 Essential (primary) hypertension: Secondary | ICD-10-CM

## 2022-12-25 DIAGNOSIS — F17210 Nicotine dependence, cigarettes, uncomplicated: Secondary | ICD-10-CM

## 2022-12-25 DIAGNOSIS — Z720 Tobacco use: Secondary | ICD-10-CM

## 2022-12-25 DIAGNOSIS — Z3041 Encounter for surveillance of contraceptive pills: Secondary | ICD-10-CM

## 2022-12-25 MED ORDER — NORETHINDRONE 0.35 MG PO TABS: 1.0000 | ORAL_TABLET | Freq: Every day | ORAL | 3 refills | Status: DC

## 2022-12-25 MED ORDER — AMLODIPINE BESYLATE 5 MG PO TABS: 5.0000 mg | ORAL_TABLET | Freq: Every day | ORAL | 3 refills | Status: DC

## 2023-09-02 NOTE — Patient Instructions (Incomplete)
Preventive Care 108-34 Years Old, Female Preventive care refers to lifestyle choices and visits with your health care provider that can promote health and wellness. Preventive care visits are also called wellness exams. What can I expect for my preventive care visit? Counseling During your preventive care visit, your health care provider may ask about your: Medical history, including: Past medical problems. Family medical history. Pregnancy history. Current health, including: Menstrual cycle. Method of birth control. Emotional well-being. Home life and relationship well-being. Sexual activity and sexual health. Lifestyle, including: Alcohol, nicotine or tobacco, and drug use. Access to firearms. Diet, exercise, and sleep habits. Work and work Astronomer. Sunscreen use. Safety issues such as seatbelt and bike helmet use. Physical exam Your health care provider may check your: Height and weight. These may be used to calculate your BMI (body mass index). BMI is a measurement that tells if you are at a healthy weight. Waist circumference. This measures the distance around your waistline. This measurement also tells if you are at a healthy weight and may help predict your risk of certain diseases, such as type 2 diabetes and high blood pressure. Heart rate and blood pressure. Body temperature. Skin for abnormal spots. What immunizations do I need?  Vaccines are usually given at various ages, according to a schedule. Your health care provider will recommend vaccines for you based on your age, medical history, and lifestyle or other factors, such as travel or where you work. What tests do I need? Screening Your health care provider may recommend screening tests for certain conditions. This may include: Pelvic exam and Pap test. Lipid and cholesterol levels. Diabetes screening. This is done by checking your blood sugar (glucose) after you have not eaten for a while (fasting). Hepatitis  B test. Hepatitis C test. HIV (human immunodeficiency virus) test. STI (sexually transmitted infection) testing, if you are at risk. BRCA-related cancer screening. This may be done if you have a family history of breast, ovarian, tubal, or peritoneal cancers. Talk with your health care provider about your test results, treatment options, and if necessary, the need for more tests. Follow these instructions at home: Eating and drinking  Eat a healthy diet that includes fresh fruits and vegetables, whole grains, lean protein, and low-fat dairy products. Take vitamin and mineral supplements as recommended by your health care provider. Do not drink alcohol if: Your health care provider tells you not to drink. You are pregnant, may be pregnant, or are planning to become pregnant. If you drink alcohol: Limit how much you have to 0-1 drink a day. Know how much alcohol is in your drink. In the [place] one drink equals one 12 oz bottle of beer (355 mL), one 5 oz glass of wine (148 mL), or one 1 oz glass of hard liquor (44 mL). Lifestyle Brush your teeth every morning and night with fluoride toothpaste. Floss one time each day. Exercise for at least 30 minutes 5 or more days each week. Do not use any products that contain nicotine or tobacco. These products include cigarettes, chewing tobacco, and vaping devices, such as e-cigarettes. If you need help quitting, ask your health care provider. Do not use drugs. If you are sexually active, practice safe sex. Use a condom or other form of protection to prevent STIs. If you do not wish to become pregnant, use a form of birth control. If you plan to become pregnant, see your health care provider for a prepregnancy visit. Find healthy ways to manage stress, such as: Meditation,  yoga, or listening to music. Journaling. Talking to a trusted person. Spending time with friends and family. Minimize exposure to UV radiation to reduce your risk of skin  cancer. Safety Always wear your seat belt while driving or riding in a vehicle. Do not drive: If you have been drinking alcohol. Do not ride with someone who has been drinking. If you have been using any mind-altering substances or drugs. While texting. When you are tired or distracted. Wear a helmet and other protective equipment during sports activities. If you have firearms in your house, make sure you follow all gun safety procedures. Seek help if you have been physically or sexually abused. What's next? Go to your health care provider once a year for an annual wellness visit. Ask your health care provider how often you should have your eyes and teeth checked. Stay up to date on all vaccines. This information is not intended to replace advice given to you by your health care provider. Make sure you discuss any questions you have with your health care provider. Document Revised: 03/08/2021 Document Reviewed: 03/08/2021 Elsevier Patient Education  2024 Elsevier Inc. Breast Self-Awareness Breast self-awareness is knowing how your breasts look and feel. You need to: Check your breasts on a regular basis. Tell your doctor about any changes. Become familiar with the look and feel of your breasts. This can help you catch a breast problem while it is still small and can be treated. You should do breast self-exams even if you have breast implants. What you need: A mirror. A well-lit room. A pillow or other soft object. How to do a breast self-exam Follow these steps to do a breast self-exam: Look for changes  Take off all the clothes above your waist. Stand in front of a mirror in a room with good lighting. Put your hands down at your sides. Compare your breasts in the mirror. Look for any difference between them, such as: A difference in shape. A difference in size. Wrinkles, dips, and bumps in one breast and not the other. Look at each breast for changes in the skin, such  as: Redness. Scaly areas. Skin that has gotten thicker. Dimpling. Open sores (ulcers). Look for changes in your nipples, such as: Fluid coming out of a nipple. Fluid around a nipple. Bleeding. Dimpling. Redness. A nipple that looks pushed in (retracted), or that has changed position. Feel for changes Lie on your back. Feel each breast. To do this: Pick a breast to feel. Place a pillow under the shoulder closest to that breast. Put the arm closest to that breast behind your head. Feel the nipple area of that breast using the hand of your other arm. Feel the area with the pads of your three middle fingers by making small circles with your fingers. Use light, medium, and firm pressure. Continue the overlapping circles, moving downward over the breast. Keep making circles with your fingers. Stop when you feel your ribs. Start making circles with your fingers again, this time going upward until you reach your collarbone. Then, make circles outward across your breast and into your armpit area. Squeeze your nipple. Check for discharge and lumps. Repeat these steps to check your other breast. Sit or stand in the tub or shower. With soapy water on your skin, feel each breast the same way you did when you were lying down. Write down what you find Writing down what you find can help you remember what to tell your doctor. Write down: What is  normal for each breast. Any changes you find in each breast. These include: The kind of changes you find. A tender or painful breast. Any lump you find. Write down its size and where it is. When you last had your monthly period (menstrual cycle). General tips If you are breastfeeding, the best time to check your breasts is after you feed your baby or after you use a breast pump. If you get monthly bleeding, the best time to check your breasts is 5-7 days after your monthly cycle ends. With time, you will become comfortable with the self-exam. You will  also start to know if there are changes in your breasts. Contact a doctor if: You see a change in the shape or size of your breasts or nipples. You see a change in the skin of your breast or nipples, such as red or scaly skin. You have fluid coming from your nipples that is not normal. You find a new lump or thick area. You have breast pain. You have any concerns about your breast health. Summary Breast self-awareness includes looking for changes in your breasts and feeling for changes within your breasts. You should do breast self-awareness in front of a mirror in a well-lit room. If you get monthly periods (menstrual cycles), the best time to check your breasts is 5-7 days after your period ends. Tell your doctor about any changes you see in your breasts. Changes include changes in size, changes on the skin, painful or tender breasts, or fluid from your nipples that is not normal. This information is not intended to replace advice given to you by your health care provider. Make sure you discuss any questions you have with your health care provider. Document Revised: 02/15/2022 Document Reviewed: 07/13/2021 Elsevier Patient Education  2024 [employer].

## 2023-09-02 NOTE — Progress Notes (Unsigned)
GYNECOLOGY ANNUAL PHYSICAL EXAM PROGRESS NOTE  Subjective:    Barbara Mendez is a 32 y.o. [phone] female who presents for an annual exam.  The patient {is/is not/has never been:13135} sexually active. The patient participates in regular exercise: {yes/no/not asked:9010}. Has the patient ever been transfused or tattooed?: {yes/no/not asked:9010}. The patient reports that there {is/is not:9024} domestic violence in her life.   The patient has the following complaints today:   Menstrual History: Menarche age: 27 No LMP recorded. (Menstrual status: Oral contraceptives).     Gynecologic History:  Contraception: OCP (estrogen/progesterone) History of STI's: Yes, 2009 Chlamydia and Gonorrhea  Last Pap: 07/31/2021. Results were: normal. 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    Past Surgical History:  Procedure Laterality Date   CESAREAN SECTION     CESAREAN SECTION N/A 06/13/2015   Procedure: CESAREAN SECTION;  Surgeon: Hildred Laser, MD;  Location: [facility];  Service: Obstetrics;  Laterality: N/A;   EXTRACORPOREAL SHOCK WAVE LITHOTRIPSY Left 05/12/2020   Procedure: EXTRACORPOREAL SHOCK WAVE LITHOTRIPSY (ESWL);  Surgeon: Sondra Come, MD;  Location: [facility];  Service: Urology;  Laterality: Left;   KIDNEY STONE SURGERY      Family History  Problem Relation Age of Onset   Hypertension Father    Breast cancer Neg Hx    Ovarian cancer Neg Hx    Colon cancer  Neg Hx    Diabetes Neg Hx     Social History   Socioeconomic History   Marital status: Married    Spouse name: Not on file   Number of children: Not on file   Years of education: Not on file   Highest education level: Not on file  Occupational History   Not on file  Tobacco Use   Smoking status: Every Day    Current packs/day: 0.00    Types: Cigars, Cigarettes    Last attempt to quit: 09/24/2014    Years since quitting: 8.9   Smokeless tobacco: Never  Vaping Use   Vaping status: Never Used  Substance and Sexual Activity   Alcohol use: Yes    Comment: occass   Drug use: Yes    Types: Marijuana   Sexual activity: Yes    Birth control/protection: None, Pill  Other Topics Concern   Not on file  Social History Narrative   Not on file   Social Determinants of Health   Financial Resource Strain: Not on file  Food Insecurity: Not on file  Transportation Needs: Not on file  Physical Activity: Not on file  Stress: Not on file  Social Connections: Not on file  Intimate Partner Violence: Not on file    Current Outpatient Medications on File Prior to Visit  Medication Sig Dispense Refill   amLODipine (NORVASC) 5 MG tablet Take 1 tablet (5 mg total) by mouth daily. 90 tablet 3   Blood Pressure Monitoring (BLOOD PRESSURE KIT) DEVI 1 Device by Does not apply route daily. Dispense what is covered through insurance. 1 each 0   norethindrone (MICRONOR) 0.35 MG tablet Take 1 tablet (0.35 mg total) by mouth daily. 84 tablet 3   ondansetron (ZOFRAN-ODT) 4 MG disintegrating tablet Take 1 tablet (4 mg total) by mouth every 8 (eight) hours as needed for nausea or vomiting. 15 tablet 0   No current facility-administered medications on file prior to visit.    No Known Allergies   Review of Systems Constitutional: negative for chills, fatigue, fevers and sweats Eyes: negative for irritation, redness and visual disturbance Ears, nose, mouth, throat, and face: negative for hearing loss,  nasal congestion, snoring and tinnitus Respiratory: negative for asthma, cough, sputum Cardiovascular: negative for chest pain, dyspnea, exertional chest pressure/discomfort, irregular heart beat, palpitations and syncope Gastrointestinal: negative for abdominal pain, change in bowel habits, nausea and vomiting Genitourinary: negative for abnormal menstrual periods, genital lesions, sexual problems and vaginal discharge, dysuria and urinary incontinence Integument/breast: negative for breast lump, breast tenderness and nipple discharge Hematologic/lymphatic: negative for bleeding and easy bruising Musculoskeletal:negative for back pain and muscle weakness Neurological: negative for dizziness, headaches, vertigo and weakness Endocrine: negative for diabetic symptoms including polydipsia, polyuria and skin dryness Allergic/Immunologic: negative for hay fever and urticaria      Objective:  There were no vitals taken for this visit. There is no height or weight on file to calculate BMI.    General Appearance:    Alert, cooperative, no distress, appears stated age  Head:    Normocephalic, without obvious abnormality, atraumatic  Eyes:    PERRL, conjunctiva/corneas clear, EOM's intact, both eyes  Ears:    Normal external ear canals, both ears  Nose:   Nares normal, septum midline, mucosa normal, no drainage or sinus tenderness  Throat:   Lips, mucosa, and tongue normal; teeth and gums normal  Neck:   Supple, symmetrical, trachea midline, no adenopathy; thyroid: no enlargement/tenderness/nodules; no carotid bruit or JVD  Back:     Symmetric, no curvature, ROM normal, no CVA tenderness  Lungs:     Clear to auscultation bilaterally, respirations unlabored  Chest Wall:    No tenderness or deformity   Heart:    Regular rate and rhythm, S1 and S2 normal, no murmur, rub or gallop  Breast Exam:    No tenderness, masses, or nipple abnormality  Abdomen:     Soft, non-tender, bowel sounds active all four  quadrants, no masses, no organomegaly.    Genitalia:    Pelvic:external genitalia normal, vagina without lesions, discharge, or tenderness, rectovaginal septum  normal. Cervix normal in appearance, no cervical motion tenderness, no adnexal masses or tenderness.  Uterus normal size, shape, mobile, regular contours, nontender.  Rectal:    Normal external sphincter.  No hemorrhoids appreciated. Internal exam not done.   Extremities:   Extremities normal, atraumatic, no cyanosis or edema  Pulses:   2+ and symmetric all extremities  Skin:   Skin color, texture, turgor normal, no rashes or lesions  Lymph nodes:   Cervical, supraclavicular, and axillary nodes normal  Neurologic:   CNII-XII intact, normal strength, sensation and reflexes throughout   .  Labs:  Lab Results  Component Value Date   WBC 5.1 11/22/2022   HGB 15.6 (H) 11/22/2022   HCT 45.9 11/22/2022  MCV 88.1 11/22/2022   PLT 251 11/22/2022    Lab Results  Component Value Date   CREATININE 0.89 11/22/2022   BUN 11 11/22/2022   NA 132 (L) 11/22/2022   K 3.8 11/22/2022   CL 100 11/22/2022   CO2 19 (L) 11/22/2022    Lab Results  Component Value Date   ALT 12 08/28/2022   AST 13 08/28/2022   ALKPHOS 66 08/28/2022   BILITOT 0.3 08/28/2022    Lab Results  Component Value Date   TSH 1.180 08/28/2022     Assessment:   No diagnosis found.   Plan:  Blood tests: {blood tests:13147}. Breast self exam technique reviewed and patient encouraged to perform self-exam monthly. Contraception: {contraceptive methods:5051}. Discussed healthy lifestyle modifications. Mammogram  : Not age appropriate Pap smear  UTD . Flu vaccine: Follow up in 1 year for annual exam   UTD  Hildred Laser, MD [facility]

## 2023-09-03 ENCOUNTER — Encounter: Payer: Self-pay | Admitting: Obstetrics and Gynecology

## 2023-09-03 ENCOUNTER — Ambulatory Visit (INDEPENDENT_AMBULATORY_CARE_PROVIDER_SITE_OTHER): Payer: Self-pay | Admitting: Obstetrics and Gynecology

## 2023-09-03 VITALS — BP 144/95 | HR 82 | Resp 16 | Ht 65.0 in | Wt 173.5 lb

## 2023-09-03 DIAGNOSIS — Z01419 Encounter for gynecological examination (general) (routine) without abnormal findings: Secondary | ICD-10-CM

## 2023-09-03 DIAGNOSIS — I10 Essential (primary) hypertension: Secondary | ICD-10-CM

## 2023-09-03 DIAGNOSIS — E663 Overweight: Secondary | ICD-10-CM

## 2023-09-03 MED ORDER — NORETHINDRONE 0.35 MG PO TABS: 1.0000 | ORAL_TABLET | Freq: Every day | ORAL | 3 refills | Status: DC

## 2023-09-03 MED ORDER — AMLODIPINE BESYLATE 5 MG PO TABS: 5.0000 mg | ORAL_TABLET | Freq: Every day | ORAL | 3 refills | Status: DC

## 2023-10-17 ENCOUNTER — Encounter: Payer: Self-pay | Admitting: Obstetrics and Gynecology

## 2023-11-08 ENCOUNTER — Emergency Department
Admission: EM | Admit: 2023-11-08 | Discharge: 2023-11-08 | Disposition: A | Discharge status: Home / Self Care | Payer: Self-pay | Attending: Emergency Medicine | Admitting: Emergency Medicine

## 2023-11-08 ENCOUNTER — Other Ambulatory Visit: Payer: Self-pay

## 2023-11-08 ENCOUNTER — Emergency Department: Payer: Self-pay

## 2023-11-08 DIAGNOSIS — R112 Nausea with vomiting, unspecified: Secondary | ICD-10-CM

## 2023-11-08 DIAGNOSIS — B349 Viral infection, unspecified: Secondary | ICD-10-CM | POA: Insufficient documentation

## 2023-11-08 DIAGNOSIS — Z87442 Personal history of urinary calculi: Secondary | ICD-10-CM | POA: Insufficient documentation

## 2023-11-08 LAB — URINALYSIS, ROUTINE W REFLEX MICROSCOPIC
Bilirubin Urine: NEGATIVE
Glucose, UA: NEGATIVE mg/dL
Ketones, ur: 20 mg/dL — AB
Leukocytes,Ua: NEGATIVE
Nitrite: NEGATIVE
Protein, ur: 100 mg/dL — AB
Specific Gravity, Urine: 1.024 (ref 1.005–1.030)
pH: 5 (ref 5.0–8.0)

## 2023-11-08 LAB — CBC
HCT: 45.3 % (ref 36.0–46.0)
Hemoglobin: 15 g/dL (ref 12.0–15.0)
MCH: 30.5 pg (ref 26.0–34.0)
MCHC: 33.1 g/dL (ref 30.0–36.0)
MCV: 92.3 fL (ref 80.0–100.0)
Platelets: 279 10*3/uL (ref 150–400)
RBC: 4.91 MIL/uL (ref 3.87–5.11)
RDW: 13.6 % (ref 11.5–15.5)
WBC: 6.5 10*3/uL (ref 4.0–10.5)
nRBC: 0 % (ref 0.0–0.2)

## 2023-11-08 LAB — COMPREHENSIVE METABOLIC PANEL
ALT: 15 U/L (ref 0–44)
AST: 24 U/L (ref 15–41)
Albumin: 4 g/dL (ref 3.5–5.0)
Alkaline Phosphatase: 60 U/L (ref 38–126)
Anion gap: 11 (ref 5–15)
BUN: 12 mg/dL (ref 6–20)
CO2: 19 mmol/L — ABNORMAL LOW (ref 22–32)
Calcium: 9.1 mg/dL (ref 8.9–10.3)
Chloride: 103 mmol/L (ref 98–111)
Creatinine, Ser: 0.78 mg/dL (ref 0.44–1.00)
GFR, Estimated: >60 mL/min (ref >60)
Glucose, Bld: 83 mg/dL (ref 70–99)
Potassium: 3.4 mmol/L — ABNORMAL LOW (ref 3.5–5.1)
Sodium: 133 mmol/L — ABNORMAL LOW (ref 135–145)
Total Bilirubin: 0.6 mg/dL (ref 0.0–1.2)
Total Protein: 8.5 g/dL — ABNORMAL HIGH (ref 6.5–8.1)

## 2023-11-08 LAB — RESP PANEL BY RT-PCR (RSV, FLU A&B, COVID)  RVPGX2
Influenza A by PCR: NEGATIVE
Influenza B by PCR: NEGATIVE
Resp Syncytial Virus by PCR: NEGATIVE
SARS Coronavirus 2 by RT PCR: NEGATIVE

## 2023-11-08 LAB — LIPASE, BLOOD: Lipase: 31 U/L (ref 11–51)

## 2023-11-08 LAB — HCG, QUANTITATIVE, PREGNANCY: hCG, Beta Chain, Quant, S: <1 m[IU]/mL (ref <5)

## 2023-11-08 MED ORDER — ONDANSETRON 4 MG PO TBDP: 4.0000 mg | ORAL_TABLET | Freq: Three times a day (TID) | ORAL | 0 refills | Status: DC | PRN

## 2023-11-08 MED ORDER — SODIUM CHLORIDE 0.9 % IV BOLUS
1000.0000 mL | Freq: Once | INTRAVENOUS | Status: AC
  Administered 2023-11-08: 1000 mL via INTRAVENOUS

## 2023-11-08 MED ORDER — ONDANSETRON HCL 4 MG/2ML IJ SOLN
4.0000 mg | Freq: Once | INTRAMUSCULAR | Status: AC
  Administered 2023-11-08: 4 mg via INTRAVENOUS
  Filled 2023-11-08: qty 2

## 2023-11-08 NOTE — ED Notes (Signed)
Patient states her nausea has improved.

## 2023-11-08 NOTE — ED Triage Notes (Signed)
Pt sts that she has been having vomiting with body aches and a head ache for the last several days. Pt sts that she is just not able to get over it.

## 2023-11-08 NOTE — ED Provider Notes (Signed)
[facility] Provider Note    Event Date/Time   First MD Initiated Contact with Patient 11/08/23 1005     (approximate)   History   Chief Complaint Emesis   HPI  Barbara Mendez is a 33 y.o. female with past medical history of kidney stones presents to the ED complaining nausea and vomiting.  Patient ports that she has been feeling ill for the past 5 days with cough, congestion, weakness, malaise, and subjective fevers.  She states that her children have been sick with similar symptoms but she has not been able to get over it.  She reports feeling nauseous for the past 2 days with multiple episodes of vomiting, has been unable to keep down liquids or solids during this time.  She denies any abdominal pain or diarrhea, has not had any dysuria or flank pain.     Physical Exam   Triage Vital Signs: ED Triage Vitals [11/08/23 0930]  Encounter Vitals Group     BP 136/78     Systolic BP Percentile      Diastolic BP Percentile      Pulse Rate 88     Resp 17     Temp 98.4 F (36.9 C)     Temp Source Oral     SpO2 98 %     Weight 159 lb (72.1 kg)     Height 5\' 5"  (1.651 m)     Head Circumference      Peak Flow      Pain Score 8     Pain Loc      Pain Education      Exclude from Growth Chart     Most recent vital signs: Vitals:   11/08/23 0930  BP: 136/78  Pulse: 88  Resp: 17  Temp: 98.4 F (36.9 C)  SpO2: 98%    Constitutional: Alert and oriented. Eyes: Conjunctivae are normal. Head: Atraumatic. Nose: No congestion/rhinnorhea. Mouth/Throat: Mucous membranes are moist.  Cardiovascular: Normal rate, regular rhythm. Grossly normal heart sounds.  2+ radial pulses bilaterally. Respiratory: Normal respiratory effort.  No retractions. Lungs CTAB. Gastrointestinal: Soft and nontender. No distention. Musculoskeletal: No lower extremity tenderness nor edema.  Neurologic:  Normal speech and language. No gross focal neurologic deficits are  appreciated.    ED Results / Procedures / Treatments   Labs (all labs ordered are listed, but only abnormal results are displayed) Labs Reviewed  COMPREHENSIVE METABOLIC PANEL - Abnormal; Notable for the following components:      Result Value   Sodium 133 (*)    Potassium 3.4 (*)    CO2 19 (*)    Total Protein 8.5 (*)    All other components within normal limits  URINALYSIS, ROUTINE W REFLEX MICROSCOPIC - Abnormal; Notable for the following components:   Color, Urine YELLOW (*)    APPearance CLOUDY (*)    Hgb urine dipstick MODERATE (*)    Ketones, ur 20 (*)    Protein, ur 100 (*)    Bacteria, UA RARE (*)    All other components within normal limits  RESP PANEL BY RT-PCR (RSV, FLU A&B, COVID)  RVPGX2  LIPASE, BLOOD  CBC  HCG, QUANTITATIVE, PREGNANCY    RADIOLOGY Chest x-ray reviewed and interpreted by me with no infiltrate, edema, or effusion.  PROCEDURES:  Critical Care performed: No  Procedures   MEDICATIONS ORDERED IN ED: Medications  sodium chloride 0.9 % bolus 1,000 mL (0 mLs Intravenous Stopped 11/08/23 1215)  ondansetron (  ZOFRAN) injection 4 mg (4 mg Intravenous Given 11/08/23 1114)     IMPRESSION / MDM / ASSESSMENT AND PLAN / ED COURSE  I reviewed the triage vital signs and the nursing notes.                              33 y.o. female with past medical history of kidney stones who presents to the ED with cough and congestion for the past 5 days, now with intractable nausea and vomiting for the past 48 hours.  Patient's presentation is most consistent with acute presentation with potential threat to life or bodily function.  Differential diagnosis includes, but is not limited to, gastroenteritis, influenza, COVID-19, dehydration, lecture light abnormality, AKI, UTI, pneumonia, pregnancy.  Patient nontoxic-appearing and in no acute distress, vital signs are unremarkable.  She has a benign abdominal exam and I suspect viral etiology, COVID and flu  testing is pending at this time.  We will also check chest x-ray, labs are reassuring with no significant anemia, leukocytosis, electrolyte abnormality, or AKI.  LFTs and lipase are unremarkable, pregnancy testing is negative.  We will hydrate with IV fluids, treat symptomatically with IV Zofran and reassess.  Chest x-ray is unremarkable, urinalysis appears contaminated but low suspicion for UTI at this time.  Patient feeling better following fluids and Zofran, she is tolerating oral intake without difficulty and is appropriate for discharge home.  She was counseled to return to the ED for new or worsening symptoms.  Patient agrees with plan.      FINAL CLINICAL IMPRESSION(S) / ED DIAGNOSES   Final diagnoses:  Viral syndrome  Nausea and vomiting, unspecified vomiting type     Rx / DC Orders   ED Discharge Orders          Ordered    ondansetron (ZOFRAN-ODT) 4 MG disintegrating tablet  Every 8 hours PRN        11/08/23 1221             Note:  This document was prepared using Dragon voice recognition software and may include unintentional dictation errors.   Chesley Noon, MD 11/08/23 [phone]

## 2023-11-25 ENCOUNTER — Encounter: Payer: Self-pay | Admitting: Obstetrics and Gynecology

## 2024-01-05 ENCOUNTER — Encounter: Payer: Self-pay | Admitting: Obstetrics and Gynecology

## 2024-01-06 MED ORDER — SLYND 4 MG PO TABS: 1.0000 | ORAL_TABLET | Freq: Every day | ORAL | 3 refills | Status: DC

## 2024-08-03 ENCOUNTER — Other Ambulatory Visit: Payer: Self-pay

## 2024-08-03 MED ORDER — SLYND 4 MG PO TABS: 1.0000 | ORAL_TABLET | Freq: Every day | ORAL | 0 refills | Status: DC

## 2024-08-12 ENCOUNTER — Other Ambulatory Visit: Payer: Self-pay

## 2024-08-12 MED ORDER — NORETHINDRONE 0.35 MG PO TABS: 1.0000 | ORAL_TABLET | Freq: Every day | ORAL | 0 refills | Status: DC

## 2024-09-08 ENCOUNTER — Other Ambulatory Visit: Payer: Self-pay

## 2024-09-08 ENCOUNTER — Ambulatory Visit: Payer: Self-pay | Admitting: Registered Nurse

## 2024-09-08 DIAGNOSIS — Z3041 Encounter for surveillance of contraceptive pills: Secondary | ICD-10-CM

## 2024-09-08 MED ORDER — NORETHINDRONE 0.35 MG PO TABS: 1.0000 | ORAL_TABLET | Freq: Every day | ORAL | 1 refills | Status: AC

## 2024-09-08 NOTE — Progress Notes (Unsigned)
 ANNUAL GYNECOLOGICAL EXAM  HPI  Barbara Mendez is a 33 y.o.-year-old [id number] who presents for an annual gynecological exam today.  She denies pelvic pain, dyspareunia, abnormal vaginal bleeding or discharge, and UTI symptoms. 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 ***Feels safe there Work: *** Exercise: *** Substances: ***EtOH, tobacco, vape, and recreational drugs  Obstetric History OB History     Gravida  3   Para  2   Term  2   Preterm      AB  1   Living  2      SAB  1   IAB      Ectopic      Multiple  0   Live Births  2            GYN/Menstrual History No LMP recorded. {Regular/irregular menstrual period abdominal pain hpi md:30583} Last Pap: 07/2021 WNL, HR HPV NEG. Contraception: ***  Prevention Dentist *** Eye exam *** Mammogram @ 33 yo Colonoscopy @ 33 yo Flu shot/vaccines ***  Current Medications Show/hide medication list[1]      ROS Constitutional: Denied constitutional symptoms, night sweats, recent illness, fatigue, fever, insomnia and weight loss.  Eyes: Denied eye symptoms, eye pain, photophobia, vision change and visual disturbance.  Ears/Nose/Throat/Neck: Denied ear, nose, throat or neck symptoms, hearing loss, nasal discharge, sinus congestion and sore throat.  Cardiovascular: Denied cardiovascular  symptoms, arrhythmia, chest pain/pressure, edema, exercise intolerance, orthopnea and palpitations.  Respiratory: Denied pulmonary symptoms, asthma, pleuritic pain, productive sputum, cough, dyspnea and wheezing.  Gastrointestinal: Denied gastro-esophageal reflux, melena, nausea and vomiting.  Genitourinary:*** Denied genitourinary symptoms including symptomatic vaginal discharge, pelvic relaxation issues, and urinary complaints.  Musculoskeletal: Denied musculoskeletal symptoms, stiffness, swelling, muscle weakness and myalgia.  Dermatologic: Denied dermatology symptoms, rash and scar.  Neurologic: Denied neurology symptoms, dizziness, headache, neck pain and syncope.  Psychiatric: Denied psychiatric symptoms, anxiety and depression.  Endocrine: Denied endocrine symptoms including hot flashes and night sweats.    OBJECTIVE  There were no vitals taken for this visit.   Physical examination General NAD, Conversant  HEENT Atraumatic; Op clear with mmm.  Normo-cephalic. Pupils reactive. Anicteric sclerae  Thyroid/Neck Smooth without nodularity or enlargement. Normal ROM.  Neck Supple.  Skin No rashes, lesions or ulceration. Normal palpated skin turgor. No nodularity.  Breasts: No masses or discharge.  Symmetric.  No axillary adenopathy.  Lungs: Clear to auscultation.No rales or wheezes. Normal Respiratory effort, no retractions.  Heart: NSR.  No murmurs or rubs appreciated. No peripheral edema  Abdomen: Soft.  Non-tender.  No masses.  No HSM. No hernia  Extremities: Moves all appropriately.  Normal ROM for age. No lymphadenopathy.  Neuro: Oriented to PPT.  Normal mood. Normal affect.     Pelvic:   Vulva: Normal appearance.  No lesions.  Vagina: No lesions or abnormalities noted.  Support: Normal pelvic support.  Urethra No masses tenderness or scarring.  Meatus Normal size without lesions or prolapse.  Cervix: Normal appearance.  No lesions.  Anus: Normal exam.  No lesions.  Perineum:  Normal exam.  No lesions.        Bimanual   Uterus: Normal size.  Non-tender.  Mobile.  AV.  Adnexae: No masses.  Non-tender to palpation.  Cul-de-sac: Negative for abnormality.    ASSESSMENT/ PLAN  1) Physical exam as noted. Discussed healthy lifestyle choices and preventive care. 2) Pap UTD. Due next 07/2026 w/ cotesting. 3) Return in one year for annual exam or as needed for concerns.   Lauraine Lakes, CNM     [1]  Outpatient Medications Prior to Visit  Medication Sig   amLODipine  (NORVASC ) 5 MG tablet Take 1 tablet (5 mg total) by mouth daily.   Blood Pressure Monitoring (BLOOD PRESSURE KIT) DEVI 1 Device by Does not apply route daily. Dispense what is covered through insurance. (Patient not taking: Reported on 09/03/2023)   Drospirenone  (SLYND ) 4 MG TABS Take 1 tablet (4 mg total) by mouth daily.   norethindrone  (MELEYA ) 0.35 MG tablet Take 1 tablet (0.35 mg total) by mouth daily.   [DISCONTINUED] ondansetron  (ZOFRAN -ODT) 4 MG disintegrating tablet Take 1 tablet (4 mg total) by mouth every 8 (eight) hours as needed for nausea or vomiting.   No facility-administered medications prior to visit.

## 2024-09-08 NOTE — Telephone Encounter (Signed)
 Spoke with patient to clarify which OCP she takes. Patient verified   norethindrone  (MELEYA ) 0.35 MG tablet  Also clarified pharmacy [facility] as 3 different pharmacies where on file. Patient states she has 1 week left of current rx. She will need 2 packs to get her to her appointment. Rx sent.

## 2024-09-11 ENCOUNTER — Other Ambulatory Visit: Payer: Self-pay | Admitting: Registered Nurse

## 2024-09-11 DIAGNOSIS — Z3041 Encounter for surveillance of contraceptive pills: Secondary | ICD-10-CM

## 2024-10-21 ENCOUNTER — Encounter: Payer: Self-pay | Admitting: Registered Nurse

## 2024-10-21 ENCOUNTER — Ambulatory Visit (INDEPENDENT_AMBULATORY_CARE_PROVIDER_SITE_OTHER): Payer: Self-pay | Admitting: Registered Nurse

## 2024-10-21 VITALS — BP 134/85 | HR 81 | Ht 65.0 in | Wt 164.6 lb

## 2024-10-21 DIAGNOSIS — Z01419 Encounter for gynecological examination (general) (routine) without abnormal findings: Secondary | ICD-10-CM

## 2024-10-21 DIAGNOSIS — I10 Essential (primary) hypertension: Secondary | ICD-10-CM | POA: Insufficient documentation

## 2024-10-21 DIAGNOSIS — Z3041 Encounter for surveillance of contraceptive pills: Secondary | ICD-10-CM | POA: Insufficient documentation

## 2024-10-21 DIAGNOSIS — H9122 Sudden idiopathic hearing loss, left ear: Secondary | ICD-10-CM | POA: Insufficient documentation

## 2024-10-21 DIAGNOSIS — F17290 Nicotine dependence, other tobacco product, uncomplicated: Secondary | ICD-10-CM | POA: Insufficient documentation

## 2024-10-21 MED ORDER — NICOTINE 7 MG/24HR TD PT24: 7.0000 mg | MEDICATED_PATCH | Freq: Every day | TRANSDERMAL | 0 refills | Status: AC

## 2024-10-21 MED ORDER — AMLODIPINE BESYLATE 10 MG PO TABS: 10.0000 mg | ORAL_TABLET | Freq: Every day | ORAL | 3 refills | Status: AC

## 2024-10-21 MED ORDER — NORETHINDRONE 0.35 MG PO TABS: 1.0000 | ORAL_TABLET | Freq: Every day | ORAL | 3 refills | Status: AC

## 2024-10-21 NOTE — Progress Notes (Deleted)
 "  ANNUAL GYNECOLOGICAL EXAM  HPI  Barbara Mendez is a 34 y.o.-year-old [id number] who presents for an annual gynecological exam today.  She denies pelvic pain, dyspareunia, abnormal vaginal bleeding or discharge, and UTI symptoms. 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 {Regular/irregular menstrual period abdominal pain hpi md:30583} Last Pap: 07/2021 WNL, HR HPV NEG. Contraception: pills  Prevention Dentist: No  Eye exam : No  Mammogram @ 34 yo Colonoscopy @ 34 yo Flu shot/vaccines: No   Current Medications Show/hide medication list[1]      ROS Constitutional: Denied constitutional symptoms, night sweats, recent illness, fatigue, fever, insomnia and weight loss.  Eyes: Denied eye symptoms, eye pain, photophobia, vision change and visual disturbance.  Ears/Nose/Throat/Neck: Denied ear, nose, throat or  neck symptoms, hearing loss, nasal discharge, sinus congestion and sore throat.  Cardiovascular: Denied cardiovascular symptoms, arrhythmia, chest pain/pressure, edema, exercise intolerance, orthopnea and palpitations.  Respiratory: Denied pulmonary symptoms, asthma, pleuritic pain, productive sputum, cough, dyspnea and wheezing.  Gastrointestinal: Denied gastro-esophageal reflux, melena, nausea and vomiting.  Genitourinary:*** Denied genitourinary symptoms including symptomatic vaginal discharge, pelvic relaxation issues, and urinary complaints.  Musculoskeletal: Denied musculoskeletal symptoms, stiffness, swelling, muscle weakness and myalgia.  Dermatologic: Denied dermatology symptoms, rash and scar.  Neurologic: Denied neurology symptoms, dizziness, headache, neck pain and syncope.  Psychiatric: Denied psychiatric symptoms, anxiety and depression.  Endocrine: Denied endocrine symptoms including hot flashes and night sweats.    OBJECTIVE  BP (!) 167/95   Pulse 88   Ht 5' 5 (1.651 m)   Wt 164 lb 9.6 oz (74.7 kg)   LMP 10/02/2024 (Approximate)   BMI 27.39 kg/m    Physical examination General NAD, Conversant  HEENT Atraumatic; Op clear with mmm.  Normo-cephalic. Pupils reactive. Anicteric sclerae  Thyroid/Neck Smooth without nodularity or enlargement. Normal ROM.  Neck Supple.  Skin No rashes, lesions or ulceration. Normal palpated skin turgor. No nodularity.  Breasts: No masses or discharge.  Symmetric.  No axillary adenopathy.  Lungs: Clear to auscultation.No rales or wheezes. Normal Respiratory effort, no retractions.  Heart: NSR.  No murmurs or rubs appreciated. No peripheral edema  Abdomen: Soft.  Non-tender.  No masses.  No HSM. No hernia  Extremities: Moves all appropriately.  Normal ROM for age. No lymphadenopathy.  Neuro: Oriented to PPT.  Normal mood. Normal affect.     Pelvic:   Vulva: Normal appearance.  No lesions.  Vagina: No lesions or abnormalities noted.   Support: Normal pelvic support.  Urethra No masses tenderness or scarring.  Meatus Normal size without lesions or prolapse.  Cervix: Normal appearance.  No lesions.  Anus: Normal exam.  No lesions.  Perineum: Normal exam.  No lesions.        Bimanual   Uterus: Normal size.  Non-tender.  Mobile.  AV.  Adnexae: No masses.  Non-tender to palpation.  Cul-de-sac: Negative for abnormality.    ASSESSMENT/ PLAN  1) Physical exam as noted. Discussed healthy lifestyle choices and preventive care. 2) Pap UTD. Due next 07/2026 w/ cotesting. 3) Return in one year for annual exam or as needed for concerns.   Lauraine Lakes, CNM     [1]  Outpatient Medications Prior to Visit  Medication Sig   norethindrone  (MICRONOR ) 0.35 MG tablet TAKE 1 TABLET(0.35 MG) BY MOUTH DAILY   amLODipine  (NORVASC ) 5 MG tablet Take 1 tablet (5 mg total) by mouth daily. (Patient not taking: Reported on 10/21/2024)   Blood Pressure Monitoring (BLOOD PRESSURE KIT) DEVI 1 Device by Does not apply route daily. Dispense what is covered through insurance. (Patient not taking: Reported on 10/21/2024)   No facility-administered medications prior to visit.   "

## 2024-10-21 NOTE — Progress Notes (Addendum)
 "  ANNUAL GYNECOLOGICAL EXAM  HPI  Barbara Mendez is a 34 y.o.-year-old [id number] who presents for an annual gynecological exam today.  She denies pelvic pain, dyspareunia, or abnormal vaginal discharge, and UTI symptoms. She reports breakthrough vaginal bleeding even on Micronor . She feels like the branded Micronor  worked better in terms of breakthrough bleeding. She's been getting generics through her pharmacy.  She was taking amlodipine  last year for her blood pressure, but stopped taking it due to cost and transportation issues. She also explains she felt the medication was doing little for her blood pressure.  She is interested in smoking cessation today. She currently smokes and vapes. She reports left sided hearing loss that started two weeks ago. She reports no event precipitated this, and she denies any cold or sinus symptoms. Denies headache or neurologic sx.  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 Irregular periods with breakthrough bleeding.  Last Pap: 07/2021 WNL, HR HPV NEG. Contraception: progesterone only pills  Prevention Dentist: No  Eye exam : No  Mammogram @ 34 yo Colonoscopy @ 34 yo Flu shot/vaccines: No   Current Medications Show/hide medication list[1]      ROS Constitutional: Denied constitutional symptoms, night sweats, recent illness, fatigue, fever, insomnia and weight loss.  Eyes: Denied eye symptoms, eye pain, photophobia, vision change and visual disturbance.  Ears/Nose/Throat/Neck: Denied ear, nose, throat or neck symptoms, hearing loss, nasal discharge, sinus congestion and sore throat.  Cardiovascular: Denied cardiovascular symptoms, arrhythmia, chest pain/pressure, edema, exercise intolerance, orthopnea and palpitations.  Respiratory: Denied pulmonary symptoms, asthma, pleuritic pain, productive sputum, cough, dyspnea and wheezing.  Gastrointestinal: Denied gastro-esophageal reflux, melena, nausea and vomiting.  Genitourinary: Denied genitourinary symptoms including symptomatic vaginal discharge, pelvic relaxation issues, and urinary complaints.  Musculoskeletal: Denied musculoskeletal symptoms, stiffness, swelling, muscle weakness and myalgia.  Dermatologic: Denied dermatology symptoms, rash and scar.  Neurologic: Denied neurology symptoms, dizziness, headache, neck pain and syncope.  Psychiatric: Denied psychiatric symptoms, anxiety and depression.  Endocrine: Denied endocrine symptoms including hot flashes and night sweats.    OBJECTIVE  BP (!) 167/95   Pulse 88   Ht 5' 5 (1.651 m)   Wt 74.7 kg  LMP 10/02/2024 (Approximate)   BMI 27.39 kg/m    Physical examination General NAD, Conversant  HEENT Atraumatic; Op clear with mmm.  Normo-cephalic. Pupils reactive. Anicteric sclerae  Thyroid/Neck Smooth without nodularity or enlargement. Normal ROM.  Neck  Supple.  Skin No rashes, lesions or ulceration. Normal palpated skin turgor. No nodularity.  Breasts: No masses or discharge.  Symmetric.  No axillary adenopathy.  Lungs: Clear to auscultation.No rales or wheezes. Normal Respiratory effort, no retractions.  Heart: NSR.  No murmurs or rubs appreciated. No peripheral edema  Abdomen: Soft.  Non-tender.  No masses.  No HSM. No hernia  Extremities: Moves all appropriately.  Normal ROM for age. No lymphadenopathy.  Neuro: Oriented to PPT.  Normal mood. Normal affect.     Pelvic: Deferred     ASSESSMENT/ PLAN  1) Physical exam as noted. Discussed healthy lifestyle choices and preventive care. 2) Hearing loss: Audiology referral sent for left sided hearing loss.  3) CHTN: Primary care referral sent. Amlodipine  10mg  ordered, and discussed utilizing [facility] to help mitigate cost of prescription. Educated that high blood pressure usually has little to no symptoms and that it is important to continue to take blood pressure medication consistently. Reviewed 3 month supply will cost aprox $21 at [facility] with [facility]. F/u 3 mos here or with PCP.  4) Smoking cessation. Discussed smoking cessation and she is open to trying the nicotine  patch.  5) Breakthrough bleeding on POP: Discussed IUD as another progesterone-only option that is associated with a favorable bleeding profile. She declines. She also doesn't want to try Depo. She is not a candidate for any estrogen-containing contraceptives as she uses tobacco and has uncontrolled HTN. Reviewed with her that if she is able to stop smoking and vaping as well as get her blood pressure under better control, we may have a few more options. F/u 3 mos. 6) Pap UTD. Due next 07/2026 w/ cotesting. SABRA Lauraine Lakes, CNM      [1]  Outpatient Medications Prior to Visit  Medication Sig   norethindrone  (MICRONOR ) 0.35 MG tablet TAKE 1 TABLET(0.35 MG) BY MOUTH DAILY   amLODipine  (NORVASC ) 5 MG tablet Take 1 tablet (5  mg total) by mouth daily. (Patient not taking: Reported on 10/21/2024)   Blood Pressure Monitoring (BLOOD PRESSURE KIT) DEVI 1 Device by Does not apply route daily. Dispense what is covered through insurance. (Patient not taking: Reported on 10/21/2024)   No facility-administered medications prior to visit.   "
